# Patient Record
Sex: Female | Born: 1985 | Race: White | Hispanic: Yes | Marital: Married | Smoking: Never smoker
Health system: Southern US, Community
[De-identification: ages and names within clinical notes are randomized; demographics above are authoritative.]

## PROBLEM LIST (undated history)

## (undated) ENCOUNTER — Inpatient Hospital Stay (HOSPITAL_COMMUNITY): Payer: Self-pay

## (undated) DIAGNOSIS — R52 Pain, unspecified: Secondary | ICD-10-CM

## (undated) DIAGNOSIS — Z789 Other specified health status: Secondary | ICD-10-CM

## (undated) DIAGNOSIS — O9089 Other complications of the puerperium, not elsewhere classified: Secondary | ICD-10-CM

---

## 2003-10-26 ENCOUNTER — Inpatient Hospital Stay (HOSPITAL_COMMUNITY): Admission: AD | Admit: 2003-10-26 | Discharge: 2003-10-26 | Payer: Self-pay | Admitting: Obstetrics & Gynecology

## 2003-10-29 ENCOUNTER — Inpatient Hospital Stay (HOSPITAL_COMMUNITY): Admission: AD | Admit: 2003-10-29 | Discharge: 2003-10-29 | Payer: Self-pay | Admitting: Obstetrics & Gynecology

## 2003-10-30 ENCOUNTER — Inpatient Hospital Stay (HOSPITAL_COMMUNITY): Admission: AD | Admit: 2003-10-30 | Discharge: 2003-10-30 | Payer: Self-pay | Admitting: *Deleted

## 2004-03-28 ENCOUNTER — Emergency Department (HOSPITAL_COMMUNITY): Admission: EM | Admit: 2004-03-28 | Discharge: 2004-03-28 | Payer: Self-pay | Admitting: Emergency Medicine

## 2004-03-31 ENCOUNTER — Ambulatory Visit (HOSPITAL_COMMUNITY): Admission: RE | Admit: 2004-03-31 | Discharge: 2004-03-31 | Payer: Self-pay

## 2005-05-13 ENCOUNTER — Inpatient Hospital Stay (HOSPITAL_COMMUNITY): Admission: AD | Admit: 2005-05-13 | Discharge: 2005-05-13 | Payer: Self-pay | Admitting: Obstetrics & Gynecology

## 2005-07-04 ENCOUNTER — Ambulatory Visit (HOSPITAL_COMMUNITY): Admission: RE | Admit: 2005-07-04 | Discharge: 2005-07-04 | Payer: Self-pay | Admitting: Obstetrics & Gynecology

## 2005-09-13 ENCOUNTER — Encounter: Admission: RE | Admit: 2005-09-13 | Discharge: 2005-09-19 | Payer: Self-pay | Admitting: Obstetrics

## 2005-11-12 ENCOUNTER — Inpatient Hospital Stay (HOSPITAL_COMMUNITY): Admission: AD | Admit: 2005-11-12 | Discharge: 2005-11-15 | Payer: Self-pay | Admitting: Obstetrics

## 2005-11-13 ENCOUNTER — Encounter (INDEPENDENT_AMBULATORY_CARE_PROVIDER_SITE_OTHER): Payer: Self-pay | Admitting: *Deleted

## 2007-04-04 ENCOUNTER — Ambulatory Visit: Payer: Self-pay | Admitting: Family Medicine

## 2007-04-30 ENCOUNTER — Ambulatory Visit: Payer: Self-pay | Admitting: *Deleted

## 2007-04-30 ENCOUNTER — Ambulatory Visit: Payer: Self-pay | Admitting: Family Medicine

## 2007-11-15 ENCOUNTER — Encounter (INDEPENDENT_AMBULATORY_CARE_PROVIDER_SITE_OTHER): Payer: Self-pay | Admitting: Nurse Practitioner

## 2007-11-15 LAB — CONVERTED CEMR LAB
ALT: 172 units/L — ABNORMAL HIGH (ref 0–35)
AST: 72 units/L — ABNORMAL HIGH (ref 0–37)
Albumin: 4.5 g/dL (ref 3.5–5.2)
Alkaline Phosphatase: 116 units/L (ref 39–117)
BUN: 12 mg/dL (ref 6–23)
Basophils Absolute: 0 10*3/uL (ref 0.0–0.1)
Basophils Relative: 0 % (ref 0–1)
CO2: 26 meq/L (ref 19–32)
Calcium: 9.5 mg/dL (ref 8.4–10.5)
Chloride: 101 meq/L (ref 96–112)
Cholesterol: 178 mg/dL (ref 0–200)
Creatinine, Ser: 0.5 mg/dL (ref 0.40–1.20)
Eosinophils Absolute: 0.3 10*3/uL (ref 0.0–0.7)
Eosinophils Relative: 3 % (ref 0–5)
Glucose, Bld: 110 mg/dL — ABNORMAL HIGH (ref 70–99)
HCT: 43.2 % (ref 36.0–46.0)
HDL: 37 mg/dL — ABNORMAL LOW (ref >39)
Hemoglobin: 14 g/dL (ref 12.0–15.0)
LDL Cholesterol: 109 mg/dL — ABNORMAL HIGH (ref 0–99)
Lymphocytes Relative: 36 % (ref 12–46)
Lymphs Abs: 4 10*3/uL (ref 0.7–4.0)
MCHC: 32.4 g/dL (ref 30.0–36.0)
MCV: 92.7 fL (ref 78.0–100.0)
Monocytes Absolute: 0.8 10*3/uL (ref 0.1–1.0)
Monocytes Relative: 8 % (ref 3–12)
Neutro Abs: 5.9 10*3/uL (ref 1.7–7.7)
Neutrophils Relative %: 54 % (ref 43–77)
Platelets: 337 10*3/uL (ref 150–400)
Potassium: 4.3 meq/L (ref 3.5–5.3)
RBC: 4.66 M/uL (ref 3.87–5.11)
RDW: 13.3 % (ref 11.5–15.5)
Sodium: 141 meq/L (ref 135–145)
TSH: 12.216 microintl units/mL — ABNORMAL HIGH (ref 0.350–5.50)
Total Bilirubin: 1 mg/dL (ref 0.3–1.2)
Total CHOL/HDL Ratio: 4.8
Total Protein: 8.2 g/dL (ref 6.0–8.3)
Triglycerides: 159 mg/dL — ABNORMAL HIGH (ref <150)
VLDL: 32 mg/dL (ref 0–40)
WBC: 11 10*3/uL — ABNORMAL HIGH (ref 4.0–10.5)

## 2008-02-20 ENCOUNTER — Ambulatory Visit: Payer: Self-pay | Admitting: Family Medicine

## 2008-03-17 ENCOUNTER — Ambulatory Visit: Payer: Self-pay | Admitting: Family Medicine

## 2008-03-17 ENCOUNTER — Encounter (INDEPENDENT_AMBULATORY_CARE_PROVIDER_SITE_OTHER): Payer: Self-pay | Admitting: Family Medicine

## 2008-03-17 LAB — CONVERTED CEMR LAB
Chlamydia, DNA Probe: NEGATIVE
GC Probe Amp, Genital: NEGATIVE

## 2011-01-27 NOTE — Discharge Summary (Signed)
[name], [name]                ACCOUNT NO.:  [id number]   MEDICAL RECORD NO.:  [id number]          PATIENT TYPE:  INP   LOCATION:  9105                          FACILITY:  [facility]   PHYSICIAN:  Roseanna Rainbow, M.D.DATE OF BIRTH:  Apr 23, 1986   DATE OF ADMISSION:  11/12/2005  DATE OF DISCHARGE:  11/15/2005                                 DISCHARGE SUMMARY   CHIEF COMPLAINT:  The patient is a 25 year old, G2, P0 with an estimated  date of confinement of November 28, 2005, complaining of rupture membranes and  uterine contractions.   HISTORY OF PRESENT ILLNESS:  Please see the above.   PAST SURGICAL HISTORY:  None.   PAST MEDICAL HISTORY:  None.   MEDICATIONS:  Prenatal vitamins.   ALLERGIES:  No known drug allergies.   SOCIAL HISTORY:  She is married.  She denies any tobacco, ethanol or drug  use.   PHYSICAL EXAMINATION:  VITAL SIGNS:  Temperature 97.8, pulse 93,  respirations 20, blood pressure 127/78.  ABDOMEN:  Gravid and nontender.  PELVIC:  Cervix 4 cm dilated, 100% effaced.  Vertex head and -1 per the R.N.  External fetal monitoring reactive.   ASSESSMENT/PLAN:  Term pregnancy in active labor.   HOSPITAL COURSE:  The patient was admitted and was subsequently found to  have a breech presentation at which point she was brought for cesarean  delivery.  Please see the dictated operative summary.  Her postop course was  remarkable for a hemoglobin of 8.7.  On postop day #1, she was  hemodynamically stable.  The remainder of her hospital course was  uneventful.  She was discharged to home on postop day #3 tolerating a  regular diet.   DISCHARGE DIAGNOSES:  1.  Intrauterine pregnancy at term.  2.  Breech presentation.  3.  Active labor.   PROCEDURE:  Primary cesarean delivery.   CONDITION ON DISCHARGE:  Good.   DIET:  Regular.   ACTIVITY:  Routine.   DISCHARGE MEDICATIONS:  1.  Percocet.  2.  Prenatal vitamins.  3.  Micronor.  4.  Ferrous sulfate.   DISPOSITION:  The patient was to follow up in the office in 2 weeks.      Roseanna Rainbow, M.D.  Electronically Signed     LAJ/MEDQ  D:  12/09/2005  T:  12/10/2005  Job:  [id number]

## 2011-01-27 NOTE — Op Note (Signed)
[name], [name]                ACCOUNT NO.:  [id number]   MEDICAL RECORD NO.:  [id number]          PATIENT TYPE:  INP   LOCATION:  9105                          FACILITY:  [facility]   PHYSICIAN:  Charles A. Clearance Coots, M.D.DATE OF BIRTH:  [birth year]   DATE OF PROCEDURE:  11/12/2005  DATE OF DISCHARGE:                                 OPERATIVE REPORT   PREOPERATIVE DIAGNOSIS:  Term pregnancy, active labor, breech presentation.   POSTOPERATIVE DIAGNOSIS:  Term pregnancy, active labor, breech presentation.   PROCEDURE:  Primary low transverse cesarean section.   SURGEON:  Dr. Coral Ceo   ANESTHESIA:  Epidural.   ESTIMATED BLOOD LOSS:  800 mL.   IV FLUIDS:  1000 mL   URINE OUTPUT:  200 mL clear.   COMPLICATIONS:  None.  Foley to gravity.   FINDINGS:  A viable female at 2252, Apgars of 8 at one minute, 9 at five  minutes, weight of 7 pounds 3 ounces, normal uterus, ovaries, and fallopian  tubes.   OPERATION:  The patient was brought to the operating room and after  satisfactory redosing of the epidural, the abdomen was prepped and draped in  the usual sterile fashion.  Pfannenstiel's skin incision was made with a  scalpel, and it was deepened down to the fascia with the scalpel.  Fascia  was nicked in the midline, and the fascial incision was extended to left and  to the right with curved Mayo scissors.  The superior and inferior fascial  edges were taken off the rectus muscle with blunt and sharp dissection.  The  rectus muscles bluntly divided in midline.  Peritoneum was entered digitally  and was digitally extended to the left and to the right.  Bladder blade was  positioned, and the vesicouterine fold with peritoneum above the reflection  of the urinary bladder was grasped with forceps and was incised and  undermined with Metzenbaum scissors.  The incision was extended to the left  and to the right with the Metzenbaum scissors.  The bladder flap was bluntly  developed, and  the bladder blade was repositioned in front of the urinary  bladder, placing it well out of the operative field.  The uterus was then  entered transversely in the lower uterine segment with the scalpel.  The  clear fluid was expelled.  The uterine incision was extended to the left and  to the right digitally.  The infant was then delivered as a frank breech in  routine fashion without complications.  The infant's mouth and nose were  suctioned with a suction bulb, and the umbilical cord was doubly clamped and  cut, and the infant was handed off to the nursery staff.  Cord blood was  obtained, and placenta was spontaneously expelled from the uterine cavity  intact.  The endometrial surface was thoroughly debrided with a dry lap  sponge.  The edges of the uterine incision were grasped with ring forceps,  and the uterus was closed with continuous interlocking suture of 0 Monocryl.  Hemostasis was excellent.  The pelvic cavity was thoroughly irrigated with  warm  saline solution, and all areas of clot were removed.  There was no  active bleeding from the closure of the uterus.  The abdomen was then closed  as follows.  Peritoneum was closed with continuous suture of 2-0 Monocryl.  Fascia was closed with continuous suture of 0 Vicryl.  Subcutaneous tissue  was thoroughly irrigated with warm saline solution, and all areas of  subcutaneous bleeding were coagulated with the Bovie.  The skin edges were  approximated with sterile stainless steel staples.  Sterile bandage was  applied to the incision closure.  Surgical technician indicated that all  needle, sponge, and instrument counts were correct.  The patient tolerated  the procedure well, transported to the recovery room in satisfactory  condition.      Charles A. Clearance Coots, M.D.  Electronically Signed     CAH/MEDQ  D:  11/12/2005  T:  11/13/2005  Job:  [id number]

## 2011-05-25 ENCOUNTER — Encounter (HOSPITAL_COMMUNITY): Payer: Self-pay

## 2011-05-25 ENCOUNTER — Inpatient Hospital Stay (HOSPITAL_COMMUNITY)
Admission: AD | Admit: 2011-05-25 | Discharge: 2011-05-25 | Disposition: A | Discharge status: Home / Self Care | Payer: Medicaid Other | Source: Ambulatory Visit | Attending: Obstetrics & Gynecology | Admitting: Obstetrics & Gynecology

## 2011-05-25 DIAGNOSIS — N949 Unspecified condition associated with female genital organs and menstrual cycle: Secondary | ICD-10-CM

## 2011-05-25 DIAGNOSIS — R109 Unspecified abdominal pain: Secondary | ICD-10-CM | POA: Insufficient documentation

## 2011-05-25 DIAGNOSIS — O99891 Other specified diseases and conditions complicating pregnancy: Secondary | ICD-10-CM | POA: Insufficient documentation

## 2011-05-25 HISTORY — DX: Other specified health status: Z78.9

## 2011-05-25 LAB — WET PREP, GENITAL
Clue Cells Wet Prep HPF POC: NONE SEEN
Trich, Wet Prep: NONE SEEN
Yeast Wet Prep HPF POC: NONE SEEN

## 2011-05-25 LAB — URINALYSIS, ROUTINE W REFLEX MICROSCOPIC
Bilirubin Urine: NEGATIVE
Glucose, UA: NEGATIVE mg/dL
Hgb urine dipstick: NEGATIVE
Ketones, ur: NEGATIVE mg/dL
Nitrite: NEGATIVE
Protein, ur: NEGATIVE mg/dL
Specific Gravity, Urine: 1.015 (ref 1.005–1.030)
Urobilinogen, UA: 0.2 mg/dL (ref 0.0–1.0)
pH: 8 (ref 5.0–8.0)

## 2011-05-25 LAB — URINE MICROSCOPIC-ADD ON

## 2011-05-25 NOTE — ED Provider Notes (Signed)
Attestation of Attending Supervision of Advanced Practitioner: Evaluation and management procedures were performed by the PA/NP/CNM/OB Fellow under my supervision/collaboration. Chart reviewed and agree with management and plan.  Ashaunti Treptow A 05/25/2011 11:22 AM

## 2011-05-25 NOTE — ED Provider Notes (Signed)
Attestation of Attending Supervision of Advanced Practitioner: Evaluation and management procedures were performed by the PA/NP/CNM/OB Fellow under my supervision/collaboration. Chart reviewed and agree with management and plan.  Novalyn Lajara A 05/25/2011 6:49 PM   

## 2011-05-25 NOTE — ED Provider Notes (Signed)
History     Chief Complaint  Patient presents with   Abdominal Pain   HPI [name] is a G3P1011 at [redacted] wks GA by LMP who presents with 2 day history of lower abdominal pain.  Describes the pain as crampy and "similar to pain during period", rates the pain as 3-6/10.  Pain present in right and left lower abdominal quadrants, denies radiation of pain to back.  Pain aggravated by activity and alleviated when she is resting. Reports 2 week history of white discharge that is not associated with vaginal pain, bleeding, pruritis or foul odor.  Reports being sexually active and denies use of barrier protection.  Denies dysuria, urinary frequency or urgency.  States the pregnancy has been uncomplicated to date.  Reports history of similar pain during previous pregnancy.  OB History    Grav Para Term Preterm Abortions TAB SAB Ect Mult Living   3 1 1  1  1   1       Past Medical History  Diagnosis Date   No pertinent past medical history     Past Surgical History  Procedure Date   Cesarean section     No family history on file.  History  Substance Use Topics   Smoking status: Never Smoker    Smokeless tobacco: Not on file   Alcohol Use: No    Allergies: No Known Allergies  Prescriptions prior to admission  Medication Sig Dispense Refill   prenatal vitamin w/FE, FA (PRENATAL 1 + 1) 27-1 MG TABS Take 1 tablet by mouth daily.          Review of Systems  Constitutional: Negative for fever and chills.  HENT: Negative for hearing loss, ear pain, nosebleeds, congestion, sore throat, tinnitus and ear discharge.   Eyes: Negative.   Respiratory: Negative for cough, hemoptysis, sputum production, shortness of breath, wheezing and stridor.   Cardiovascular: Negative.   Gastrointestinal: Positive for abdominal pain (2 day hisotry abdominal pain in R and L lower abd quadrants), constipation and blood in stool. Negative for nausea, vomiting and diarrhea.  Genitourinary: Negative for  dysuria, urgency, frequency, hematuria and flank pain.  Skin: Negative.   Neurological: Negative.  Negative for headaches.  Psychiatric/Behavioral: Negative.    Physical Exam   Blood pressure 118/74, pulse 84, temperature 98.5 F (36.9 C), temperature source Oral, resp. rate 16, height 5\' 3"  (1.6 m), weight 122 lb (55.339 kg), last menstrual period 02/02/2011, SpO2 98.00%.  Results for orders placed during the hospital encounter of 05/25/11 (from the past 48 hour(s))  URINALYSIS, ROUTINE W REFLEX MICROSCOPIC     Status: Abnormal   Collection Time   05/25/11  8:23 AM      Component Value Range Comment   Color, Urine YELLOW  YELLOW     Appearance CLEAR  CLEAR     Specific Gravity, Urine 1.015  1.005 - 1.030     pH 8.0  5.0 - 8.0     Glucose, UA NEGATIVE  NEGATIVE (mg/dL)    Hgb urine dipstick NEGATIVE  NEGATIVE     Bilirubin Urine NEGATIVE  NEGATIVE     Ketones, ur NEGATIVE  NEGATIVE (mg/dL)    Protein, ur NEGATIVE  NEGATIVE (mg/dL)    Urobilinogen, UA 0.2  0.0 - 1.0 (mg/dL)    Nitrite NEGATIVE  NEGATIVE     Leukocytes, UA SMALL (*) NEGATIVE    URINE MICROSCOPIC-ADD ON     Status: Abnormal   Collection Time   05/25/11  8:23  AM      Component Value Range Comment   Squamous Epithelial / LPF RARE  RARE     WBC, UA 3-6  <3 (WBC/hpf)    Bacteria, UA FEW (*) RARE     Urine-Other MUCOUS PRESENT     WET PREP, GENITAL     Status: Abnormal   Collection Time   05/25/11  9:43 AM      Component Value Range Comment   Yeast, Wet Prep NONE SEEN  NONE SEEN     Trich, Wet Prep NONE SEEN  NONE SEEN     Clue Cells, Wet Prep NONE SEEN  NONE SEEN     WBC, Wet Prep HPF POC FEW (*) NONE SEEN  MODERATE BACTERIA SEEN     Physical Exam  Constitutional: She is oriented to person, place, and time. She appears well-developed and well-nourished. No distress.  HENT:  Head: Normocephalic and atraumatic.  Eyes: Conjunctivae and EOM are normal. No scleral icterus.  Neck: Normal range of motion. Neck  supple. No thyromegaly present.  Cardiovascular: Normal rate, regular rhythm and normal heart sounds.   Respiratory: Breath sounds normal. No respiratory distress. She has no wheezes. She has no rales. She exhibits no tenderness.  GI: Soft. Bowel sounds are normal. She exhibits no distension and no mass. There is tenderness (Tender to palpation in R and L lower abd quadrants.  ). There is no rebound and no guarding.  Genitourinary: Vaginal discharge found.       Speculum exam revealed scant amount of white/grey discharge in posterior vagina.  Vaginal walls without lesions or laceration.  No visible blood present in vagina or originating from cervical os.  Cervical os signigicant for white discharge.  Cervix is with out lesion or ulceration. Cervix non erythematous.     On bimanual exam patient was tender to palpation.  Uterus palpation and appropriate for EGA.  Ovaries nonpalpable bilaterally.   Musculoskeletal: Normal range of motion.  Neurological: She is alert and oriented to person, place, and time. She has normal reflexes.  Skin: Skin is warm and dry. She is not diaphoretic.  Psychiatric: She has a normal mood and affect. Her behavior is normal. Judgment and thought content normal.    Fetal heart sounds present via doppler.  FHR : 155  MAU Course  Procedures  G/C culture and wet prep to lab.  MDM   Assessment and Plan  Abdominal pain in second trimester Round ligament pain  Plan:  Advise to take Tylenol as needed. Keep scheduled appt with [facility] on Sept 25th.  Adam Chenevert PA-S 05/25/2011, 9:41 AM

## 2011-05-25 NOTE — Progress Notes (Signed)
Pt states that she has been having lower abdominal pain for 2 days that comes and goes. No bleeding or discharge. Pt has her first appointment with [facility] on 9-25.

## 2011-05-25 NOTE — ED Provider Notes (Addendum)
History     CSN: [id number] Arrival date & time: 05/25/2011  8:12 AM  Chief Complaint  Patient presents with  . Abdominal Pain   HPI    SEE PA student's note  Past Medical History  Diagnosis Date  . No pertinent past medical history     Past Surgical History  Procedure Date  . Cesarean section     No family history on file.  History  Substance Use Topics  . Smoking status: Never Smoker   . Smokeless tobacco: Not on file  . Alcohol Use: No    OB History    Grav Para Term Preterm Abortions TAB SAB Ect Mult Living   3 1 1  1  1   1       Review of Systems  Physical Exam  BP 118/74  Pulse 84  Temp(Src) 98.5 F (36.9 C) (Oral)  Resp 16  Ht 5\' 3"  (1.6 m)  Wt 122 lb (55.339 kg)  BMI 21.61 kg/m2  SpO2 98%  LMP 02/02/2011  Physical Exam  ED Course  Procedures  MDM I have reviewed the student's note, observed his physical exam and agree with his findings and plan of care.       Matt Holmes, NP 05/25/11 1032  Matt Holmes, NP 05/25/11 1326

## 2011-05-26 LAB — GC/CHLAMYDIA PROBE AMP, GENITAL
Chlamydia, DNA Probe: NEGATIVE
GC Probe Amp, Genital: NEGATIVE

## 2011-06-08 LAB — RUBELLA ANTIBODY, IGM: Rubella: IMMUNE

## 2011-06-08 LAB — HIV ANTIBODY (ROUTINE TESTING W REFLEX): HIV: NONREACTIVE

## 2011-06-08 LAB — ABO/RH: RH Type: POSITIVE

## 2011-09-12 NOTE — L&D Delivery Note (Signed)
Delivery Note At 1:54 PM a viable female was delivered via NSVD  (Presentation: Right Occiput Anterior).  APGAR: 9, 9; weight 6 lb 13.2 oz (3095 g).   Placenta status: Intact, Spontaneous.  Cord: 3 vessels with the following complications: None.  Cord pH: none  Anesthesia: Epidural  Episiotomy: Median Lacerations: None Suture Repair: 2.0 chromic Est. Blood Loss (mL): 400  Mom to postpartum.  Baby to nursery-stable.  HARPER,CHARLES A 10/16/2011, 2:23 PM

## 2011-09-28 ENCOUNTER — Inpatient Hospital Stay (HOSPITAL_COMMUNITY)
Admission: AD | Admit: 2011-09-28 | Discharge: 2011-09-28 | Disposition: A | Discharge status: Home / Self Care | Payer: Medicaid Other | Source: Ambulatory Visit | Attending: Obstetrics & Gynecology | Admitting: Obstetrics & Gynecology

## 2011-09-28 ENCOUNTER — Encounter (HOSPITAL_COMMUNITY): Payer: Self-pay | Admitting: *Deleted

## 2011-09-28 DIAGNOSIS — O4700 False labor before 37 completed weeks of gestation, unspecified trimester: Secondary | ICD-10-CM | POA: Insufficient documentation

## 2011-09-28 LAB — URINALYSIS, ROUTINE W REFLEX MICROSCOPIC
Bilirubin Urine: NEGATIVE
Glucose, UA: NEGATIVE mg/dL
Hgb urine dipstick: NEGATIVE
Ketones, ur: NEGATIVE mg/dL
Nitrite: NEGATIVE
Protein, ur: NEGATIVE mg/dL
Specific Gravity, Urine: 1.01 (ref 1.005–1.030)
Urobilinogen, UA: 0.2 mg/dL (ref 0.0–1.0)
pH: 6 (ref 5.0–8.0)

## 2011-09-28 LAB — URINE MICROSCOPIC-ADD ON

## 2011-09-28 MED ORDER — TERBUTALINE SULFATE 1 MG/ML IJ SOLN
0.2500 mg | Freq: Once | INTRAMUSCULAR | Status: AC
  Administered 2011-09-28: 0.25 mg via SUBCUTANEOUS
  Filled 2011-09-28: qty 1

## 2011-09-28 NOTE — Progress Notes (Signed)
FFN collected, VE done per CNM.

## 2011-09-28 NOTE — Progress Notes (Signed)
N. Frazier, CNM at bedside.  Assessment done and poc discussed with pt.  

## 2011-09-28 NOTE — Progress Notes (Signed)
Pt presents to [facility]au for contractions. States she has felt them for the last 3 days but tonight around midnight they became more regular and painful. Pt states + fetal movement.

## 2011-09-28 NOTE — Progress Notes (Signed)
Notified of no cervical change.  Notified of contractions irregular with pt stating ctx are better.  Ok to [employer] home. Have pt follow up in office tomorrow.

## 2011-09-28 NOTE — ED Provider Notes (Signed)
History     Chief Complaint  Patient presents with  . Contractions   HPI 26 y.o. G3P1011 at [redacted]w[redacted]d with contractions x 3 days, closer and stronger tonight. No LOF or vaginal bleeding. + fetal movement. H/O c/s for breech with last pregnancy, hoping for VBAC.    Past Medical History  Diagnosis Date  . No pertinent past medical history     Past Surgical History  Procedure Date  . Cesarean section     History reviewed. No pertinent family history.  History  Substance Use Topics  . Smoking status: Never Smoker   . Smokeless tobacco: Not on file  . Alcohol Use: No    Allergies: No Known Allergies  No prescriptions prior to admission    Review of Systems  Constitutional: Negative.   Respiratory: Negative.   Cardiovascular: Negative.   Gastrointestinal: Negative for nausea, vomiting, abdominal pain, diarrhea and constipation.  Genitourinary: Negative for dysuria, urgency, frequency, hematuria and flank pain.       Negative for vaginal bleeding, Positive for contractions  Musculoskeletal: Negative.   Neurological: Negative.   Psychiatric/Behavioral: Negative.    Physical Exam   Blood pressure 125/59, pulse 75, temperature 97.8 F (36.6 C), temperature source Oral, resp. rate 18, height 5' (1.524 m), last menstrual period 02/02/2011.  Physical Exam  Nursing note and vitals reviewed. Constitutional: She is oriented to person, place, and time. She appears well-developed and well-nourished.  Cardiovascular: Normal rate.   Respiratory: Effort normal.  GI: Soft. There is no tenderness.  Genitourinary: Cervix exhibits discharge (clear mucous). No bleeding around the vagina. No vaginal discharge found.       SVE: 2/75/-2  Musculoskeletal: Normal range of motion.  Neurological: She is alert and oriented to person, place, and time.  Skin: Skin is warm and dry.  Psychiatric: She has a normal mood and affect.    MAU Course  Procedures Results for orders placed during the  hospital encounter of 09/28/11 (from the past 24 hour(s))  URINALYSIS, ROUTINE W REFLEX MICROSCOPIC     Status: Abnormal   Collection Time   09/28/11  4:10 AM      Component Value Range   Color, Urine YELLOW  YELLOW    APPearance CLEAR  CLEAR    Specific Gravity, Urine 1.010  1.005 - 1.030    pH 6.0  5.0 - 8.0    Glucose, UA NEGATIVE  NEGATIVE (mg/dL)   Hgb urine dipstick NEGATIVE  NEGATIVE    Bilirubin Urine NEGATIVE  NEGATIVE    Ketones, ur NEGATIVE  NEGATIVE (mg/dL)   Protein, ur NEGATIVE  NEGATIVE (mg/dL)   Urobilinogen, UA 0.2  0.0 - 1.0 (mg/dL)   Nitrite NEGATIVE  NEGATIVE    Leukocytes, UA SMALL (*) NEGATIVE   URINE MICROSCOPIC-ADD ON     Status: Abnormal   Collection Time   09/28/11  4:10 AM      Component Value Range   Squamous Epithelial / LPF MANY (*) RARE    WBC, UA 0-2  <3 (WBC/hpf)    Terbutaline 0.25 Admire for tocolysis. Contractions stopped, no cervical change after 2 hours.   Assessment and Plan  26 y.o. G3P1011 at [redacted]w[redacted]d Threatened preterm labor  D/C home, f/u in office tomorrow  Antanisha Mohs 09/28/2011, 8:00 AM

## 2011-09-28 NOTE — Progress Notes (Signed)
Pt up to bathroom at this time

## 2011-09-28 NOTE — Progress Notes (Signed)
Pt states ctx have slowed.  Feels better.

## 2011-10-12 LAB — STREP B DNA PROBE: GBS: NEGATIVE

## 2011-10-16 ENCOUNTER — Encounter (HOSPITAL_COMMUNITY): Payer: Self-pay | Admitting: *Deleted

## 2011-10-16 ENCOUNTER — Encounter (HOSPITAL_COMMUNITY): Payer: Self-pay | Admitting: Anesthesiology

## 2011-10-16 ENCOUNTER — Inpatient Hospital Stay (HOSPITAL_COMMUNITY): Payer: Medicaid Other | Admitting: Anesthesiology

## 2011-10-16 ENCOUNTER — Inpatient Hospital Stay (HOSPITAL_COMMUNITY)
Admission: AD | Admit: 2011-10-16 | Discharge: 2011-10-18 | DRG: 775 | Disposition: A | Discharge status: Home / Self Care | Payer: Medicaid Other | Source: Ambulatory Visit | Attending: Obstetrics | Admitting: Obstetrics

## 2011-10-16 DIAGNOSIS — O34219 Maternal care for unspecified type scar from previous cesarean delivery: Principal | ICD-10-CM | POA: Diagnosis present

## 2011-10-16 LAB — CBC
HCT: 38.7 % (ref 36.0–46.0)
Hemoglobin: 13.1 g/dL (ref 12.0–15.0)
RDW: 13.1 % (ref 11.5–15.5)
WBC: 7.5 10*3/uL (ref 4.0–10.5)

## 2011-10-16 MED ORDER — ONDANSETRON HCL 4 MG/2ML IJ SOLN: 4.0000 mg | INTRAMUSCULAR | Status: DC | PRN

## 2011-10-16 MED ORDER — LACTATED RINGERS IV SOLN
INTRAVENOUS | Status: DC
  Administered 2011-10-16: 12:00:00 via INTRAVENOUS

## 2011-10-16 MED ORDER — OXYCODONE-ACETAMINOPHEN 5-325 MG PO TABS
1.0000 | ORAL_TABLET | ORAL | Status: DC | PRN
  Administered 2011-10-17 – 2011-10-18 (×6): 1 via ORAL
  Filled 2011-10-16 (×6): qty 1

## 2011-10-16 MED ORDER — SIMETHICONE 80 MG PO CHEW: 80.0000 mg | CHEWABLE_TABLET | ORAL | Status: DC | PRN

## 2011-10-16 MED ORDER — ZOLPIDEM TARTRATE 5 MG PO TABS: 5.0000 mg | ORAL_TABLET | Freq: Every evening | ORAL | Status: DC | PRN

## 2011-10-16 MED ORDER — EPHEDRINE 5 MG/ML INJ
10.0000 mg | INTRAVENOUS | Status: DC | PRN
  Filled 2011-10-16 (×2): qty 4

## 2011-10-16 MED ORDER — LANOLIN HYDROUS EX OINT: TOPICAL_OINTMENT | CUTANEOUS | Status: DC | PRN

## 2011-10-16 MED ORDER — PRENATAL MULTIVITAMIN CH
1.0000 | ORAL_TABLET | Freq: Every day | ORAL | Status: DC
  Administered 2011-10-16 – 2011-10-18 (×3): 1 via ORAL
  Filled 2011-10-16 (×3): qty 1

## 2011-10-16 MED ORDER — DIPHENHYDRAMINE HCL 50 MG/ML IJ SOLN: 12.5000 mg | INTRAMUSCULAR | Status: DC | PRN

## 2011-10-16 MED ORDER — FENTANYL 2.5 MCG/ML BUPIVACAINE 1/10 % EPIDURAL INFUSION (WH - ANES)
14.0000 mL/h | INTRAMUSCULAR | Status: DC
  Administered 2011-10-16: 14 mL/h via EPIDURAL
  Filled 2011-10-16 (×2): qty 60

## 2011-10-16 MED ORDER — IBUPROFEN 600 MG PO TABS: 600.0000 mg | ORAL_TABLET | Freq: Four times a day (QID) | ORAL | Status: DC | PRN

## 2011-10-16 MED ORDER — ACETAMINOPHEN 325 MG PO TABS: 650.0000 mg | ORAL_TABLET | ORAL | Status: DC | PRN

## 2011-10-16 MED ORDER — SODIUM CHLORIDE 0.9 % IV SOLN
2.0000 g | Freq: Once | INTRAVENOUS | Status: DC
  Filled 2011-10-16: qty 2000

## 2011-10-16 MED ORDER — WITCH HAZEL-GLYCERIN EX PADS: 1.0000 "application " | MEDICATED_PAD | CUTANEOUS | Status: DC | PRN

## 2011-10-16 MED ORDER — DIBUCAINE 1 % RE OINT: 1.0000 "application " | TOPICAL_OINTMENT | RECTAL | Status: DC | PRN

## 2011-10-16 MED ORDER — MEDROXYPROGESTERONE ACETATE 150 MG/ML IM SUSP: 150.0000 mg | INTRAMUSCULAR | Status: DC | PRN

## 2011-10-16 MED ORDER — FLEET ENEMA 7-19 GM/118ML RE ENEM: 1.0000 | ENEMA | RECTAL | Status: DC | PRN

## 2011-10-16 MED ORDER — OXYTOCIN BOLUS FROM INFUSION
500.0000 mL | Freq: Once | INTRAVENOUS | Status: DC
  Filled 2011-10-16: qty 1000
  Filled 2011-10-16: qty 500

## 2011-10-16 MED ORDER — CITRIC ACID-SODIUM CITRATE 334-500 MG/5ML PO SOLN: 30.0000 mL | ORAL | Status: DC | PRN

## 2011-10-16 MED ORDER — IBUPROFEN 600 MG PO TABS
600.0000 mg | ORAL_TABLET | Freq: Four times a day (QID) | ORAL | Status: DC
  Administered 2011-10-16 – 2011-10-18 (×8): 600 mg via ORAL
  Filled 2011-10-16 (×9): qty 1

## 2011-10-16 MED ORDER — TETANUS-DIPHTH-ACELL PERTUSSIS 5-2.5-18.5 LF-MCG/0.5 IM SUSP
0.5000 mL | Freq: Once | INTRAMUSCULAR | Status: AC
  Administered 2011-10-17: 0.5 mL via INTRAMUSCULAR

## 2011-10-16 MED ORDER — OXYTOCIN 10 UNIT/ML IJ SOLN
40.0000 [IU] | Freq: Once | INTRAVENOUS | Status: AC
  Administered 2011-10-16: 40 [IU] via INTRAVENOUS
  Filled 2011-10-16: qty 4

## 2011-10-16 MED ORDER — LIDOCAINE HCL (PF) 1 % IJ SOLN
30.0000 mL | INTRAMUSCULAR | Status: DC | PRN
  Administered 2011-10-16: 30 mL via SUBCUTANEOUS
  Filled 2011-10-16: qty 30

## 2011-10-16 MED ORDER — ONDANSETRON HCL 4 MG PO TABS: 4.0000 mg | ORAL_TABLET | ORAL | Status: DC | PRN

## 2011-10-16 MED ORDER — OXYTOCIN 20 UNITS IN LACTATED RINGERS INFUSION - SIMPLE
125.0000 mL/h | Freq: Once | INTRAVENOUS | Status: AC
  Administered 2011-10-16: 999 mL/h via INTRAVENOUS

## 2011-10-16 MED ORDER — LACTATED RINGERS IV SOLN: 500.0000 mL | Freq: Once | INTRAVENOUS | Status: DC

## 2011-10-16 MED ORDER — OXYTOCIN 20 UNITS IN LACTATED RINGERS INFUSION - SIMPLE: 125.0000 mL/h | INTRAVENOUS | Status: DC | PRN

## 2011-10-16 MED ORDER — BENZOCAINE-MENTHOL 20-0.5 % EX AERO: 1.0000 "application " | INHALATION_SPRAY | CUTANEOUS | Status: DC | PRN

## 2011-10-16 MED ORDER — LIDOCAINE HCL 1.5 % IJ SOLN
INTRAMUSCULAR | Status: DC | PRN
  Administered 2011-10-16 (×2): 5 mL via EPIDURAL

## 2011-10-16 MED ORDER — METHYLERGONOVINE MALEATE 0.2 MG PO TABS: 0.2000 mg | ORAL_TABLET | ORAL | Status: DC | PRN

## 2011-10-16 MED ORDER — PHENYLEPHRINE 40 MCG/ML (10ML) SYRINGE FOR IV PUSH (FOR BLOOD PRESSURE SUPPORT)
80.0000 ug | PREFILLED_SYRINGE | INTRAVENOUS | Status: DC | PRN
  Filled 2011-10-16 (×2): qty 5

## 2011-10-16 MED ORDER — DIPHENHYDRAMINE HCL 25 MG PO CAPS: 25.0000 mg | ORAL_CAPSULE | Freq: Four times a day (QID) | ORAL | Status: DC | PRN

## 2011-10-16 MED ORDER — EPHEDRINE 5 MG/ML INJ: 10.0000 mg | INTRAVENOUS | Status: DC | PRN

## 2011-10-16 MED ORDER — METHYLERGONOVINE MALEATE 0.2 MG/ML IJ SOLN: 0.2000 mg | INTRAMUSCULAR | Status: DC | PRN

## 2011-10-16 MED ORDER — OXYCODONE-ACETAMINOPHEN 5-325 MG PO TABS: 1.0000 | ORAL_TABLET | ORAL | Status: DC | PRN

## 2011-10-16 MED ORDER — SENNOSIDES-DOCUSATE SODIUM 8.6-50 MG PO TABS
2.0000 | ORAL_TABLET | Freq: Every day | ORAL | Status: DC
  Administered 2011-10-16 – 2011-10-17 (×2): 2 via ORAL

## 2011-10-16 MED ORDER — ONDANSETRON HCL 4 MG/2ML IJ SOLN: 4.0000 mg | Freq: Four times a day (QID) | INTRAMUSCULAR | Status: DC | PRN

## 2011-10-16 MED ORDER — LACTATED RINGERS IV SOLN: 500.0000 mL | INTRAVENOUS | Status: DC | PRN

## 2011-10-16 MED ORDER — MAGNESIUM HYDROXIDE 400 MG/5ML PO SUSP: 30.0000 mL | ORAL | Status: DC | PRN

## 2011-10-16 MED ORDER — PHENYLEPHRINE 40 MCG/ML (10ML) SYRINGE FOR IV PUSH (FOR BLOOD PRESSURE SUPPORT): 80.0000 ug | PREFILLED_SYRINGE | INTRAVENOUS | Status: DC | PRN

## 2011-10-16 NOTE — Progress Notes (Signed)
SROM @ 1020 this AM; G2P1 @ 36w 4d;

## 2011-10-16 NOTE — Progress Notes (Signed)
ROBIE OATS is a 26 y.o. G3P1011 at [redacted]w[redacted]d by LMP admitted for active labor, rupture of membranes  Subjective:   Objective: BP 140/70  Pulse 101  Temp(Src) 98 F (36.7 C) (Oral)  Resp 18  Ht 5' (1.524 m)  Wt 156 lb (70.761 kg)  BMI 30.47 kg/m2  SpO2 99%  LMP 02/02/2011      FHT:  FHR: 150 bpm, variability: moderate,  accelerations:  Present,  decelerations:  Absent UC:   regular, every 3 minutes SVE:   Dilation: 10 Effacement (%): 100 Station: +2 Exam by:: Rzhang,rnc-ob  Labs: Lab Results  Component Value Date   WBC 7.5 10/16/2011   HGB 13.1 10/16/2011   HCT 38.7 10/16/2011   MCV 91.5 10/16/2011   PLT 230 10/16/2011    Assessment / Plan: Spontaneous labor, progressing normally  Labor: Progressing normally Preeclampsia:  n/a Fetal Wellbeing:  Category I Pain Control:  Epidural I/D:  n/a Anticipated MOD:  NSVD  Veryl Abril A 10/16/2011, 1:33 PM

## 2011-10-16 NOTE — Anesthesia Procedure Notes (Signed)
Epidural Patient location during procedure: OB Start time: 10/16/2011 12:25 PM  Staffing Anesthesiologist: Brayton Caves R Performed by: anesthesiologist   Preanesthetic Checklist Completed: patient identified, site marked, surgical consent, pre-op evaluation, timeout performed, IV checked, risks and benefits discussed and monitors and equipment checked  Epidural Patient position: sitting Prep: site prepped and draped and DuraPrep Patient monitoring: continuous pulse ox and blood pressure Approach: midline Injection technique: LOR air and LOR saline  Needle:  Needle type: Tuohy  Needle gauge: 17 G Needle length: 9 cm Needle insertion depth: 4 cm Catheter type: closed end flexible Catheter size: 19 Gauge Catheter at skin depth: 8 cm Test dose: negative  Assessment Events: blood not aspirated, injection not painful, no injection resistance, negative IV test and no paresthesia  Additional Notes Patient identified.  Risk benefits discussed including failed block, incomplete pain control, headache, nerve damage, paralysis, blood pressure changes, nausea, vomiting, reactions to medication both toxic or allergic, and postpartum back pain.  Patient expressed understanding and wished to proceed.  All questions were answered.  Sterile technique used throughout procedure and epidural site dressed with sterile barrier dressing. No paresthesia or other complications noted.The patient did not experience any signs of intravascular injection such as tinnitus or metallic taste in mouth nor signs of intrathecal spread such as rapid motor block. Please see nursing notes for vital signs.

## 2011-10-16 NOTE — Anesthesia Postprocedure Evaluation (Signed)
  Anesthesia Post-op Note  Patient: [name]  Procedure(s) Performed: * No procedures listed *  Patient Location: PACU and Mother/Baby  Anesthesia Type: Epidural  Level of Consciousness: awake, alert  and oriented  Airway and Oxygen Therapy: Patient Spontanous Breathing  Post-op Pain: none  Post-op Assessment: Patient's Cardiovascular Status Stable and Respiratory Function Stable  Post-op Vital Signs: stable  Complications: No apparent anesthesia complications

## 2011-10-16 NOTE — H&P (Signed)
[name] is a 26 y.o. female presenting for ROM. Maternal Medical History:  Reason for admission: Reason for admission: rupture of membranes and contractions.  Contractions: Onset was 3-5 hours ago.   Frequency: regular.   Duration is approximately 1 minute.    Fetal activity: Perceived fetal activity is normal.   Last perceived fetal movement was within the past hour.    Prenatal complications: no prenatal complications Prenatal Complications - Diabetes: none.    OB History    Grav Para Term Preterm Abortions TAB SAB Ect Mult Living   3 1 1  1  1   1      Past Medical History  Diagnosis Date  . No pertinent past medical history    Past Surgical History  Procedure Date  . Cesarean section    Family History: family history is not on file. Social History:  reports that she has never smoked. She has never used smokeless tobacco. She reports that she does not drink alcohol or use illicit drugs.  Review of Systems  All other systems reviewed and are negative.    Dilation: 9 Effacement (%): 100 Station: +1 Exam by:: rzhang,rnc-ob Blood pressure 144/81, pulse 92, temperature 97.6 F (36.4 C), temperature source Oral, resp. rate 20, height 5' (1.524 m), weight 156 lb (70.761 kg), last menstrual period 02/02/2011, SpO2 99.00%. Maternal Exam:  Uterine Assessment: Contraction strength is firm.  Contraction duration is 1 minute. Abdomen: Patient reports no abdominal tenderness. Fetal presentation: vertex  Introitus: Normal vulva. Normal vagina.  Pelvis: adequate for delivery.   Cervix: Cervix evaluated by digital exam.     Physical Exam  Nursing note and vitals reviewed. Constitutional: She is oriented to person, place, and time. She appears well-developed and well-nourished.  HENT:  Head: Normocephalic and atraumatic.  Cardiovascular: Normal rate.   Respiratory: Effort normal.  GI: Soft.  Genitourinary: Vagina normal and uterus normal.  Musculoskeletal: Normal  range of motion.  Neurological: She is alert and oriented to person, place, and time. She has normal reflexes.  Skin: Skin is warm and dry.    Prenatal labs: ABO, Rh: O/Positive/-- (09/27 0000) Antibody: Negative (09/27 0000) Rubella: Immune (09/27 0000) RPR: Nonreactive (09/27 0000)  HBsAg: Negative (09/27 0000)  HIV: Non-reactive (09/27 0000)  GBS: Negative (01/31 0000)   Assessment/Plan: 36 weeks.  ROM.  Active labor.  VBAC.  Previous C/S for breech.   HARPER,CHARLES A 10/16/2011, 1:12 PM

## 2011-10-16 NOTE — Anesthesia Preprocedure Evaluation (Signed)

## 2011-10-17 LAB — CBC
HCT: 32.6 % — ABNORMAL LOW (ref 36.0–46.0)
MCH: 30.1 pg (ref 26.0–34.0)
MCHC: 32.5 g/dL (ref 30.0–36.0)
MCV: 92.6 fL (ref 78.0–100.0)
RDW: 13.4 % (ref 11.5–15.5)

## 2011-10-17 LAB — RPR: RPR Ser Ql: NONREACTIVE

## 2011-10-17 NOTE — Progress Notes (Signed)
UR Chart review completed.  

## 2011-10-17 NOTE — Progress Notes (Signed)
Post Partum Day 1 Subjective: no complaints  Objective: Blood pressure 101/67, pulse 91, temperature 98.3 F (36.8 C), temperature source Oral, resp. rate 20, height 5' (1.524 m), weight 156 lb (70.761 kg), last menstrual period 02/02/2011, SpO2 99.00%, unknown if currently breastfeeding.  Physical Exam:  General: alert and no distress Lochia: appropriate Uterine Fundus: firm Incision: healing well DVT Evaluation: No evidence of DVT seen on physical exam.   Basename 10/17/11 0538 10/16/11 1145  HGB 10.6* 13.1  HCT 32.6* 38.7    Assessment/Plan: Plan for discharge tomorrow   LOS: 1 day   HARPER,CHARLES A 10/17/2011, 1:33 PM

## 2011-10-18 MED ORDER — IBUPROFEN 600 MG PO TABS: 600.0000 mg | ORAL_TABLET | Freq: Four times a day (QID) | ORAL | Status: DC

## 2011-10-18 NOTE — Progress Notes (Signed)
Post Partum Day 2 Subjective: no complaints  Objective: Blood pressure 105/69, pulse 71, temperature 98.4 F (36.9 C), temperature source Oral, resp. rate 18, height 5' (1.524 m), weight 156 lb (70.761 kg), last menstrual period 02/02/2011, SpO2 99.00%, unknown if currently breastfeeding.  Physical Exam:  General: alert and no distress Lochia: appropriate Uterine Fundus: firm Incision: healing well DVT Evaluation: No evidence of DVT seen on physical exam.   Basename 10/17/11 0538 10/16/11 1145  HGB 10.6* 13.1  HCT 32.6* 38.7    Assessment/Plan: Discharge home   LOS: 2 days   [name] A 10/18/2011, 8:29 AM

## 2011-10-18 NOTE — Discharge Summary (Signed)
Obstetric Discharge Summary Reason for Admission: onset of labor Prenatal Procedures: ultrasound Intrapartum Procedures: spontaneous vaginal delivery Postpartum Procedures: none Complications-Operative and Postpartum: none Hemoglobin  Date Value Range Status  10/17/2011 10.6* 12.0-15.0 (g/dL) Final     DELTA CHECK NOTED     REPEATED TO VERIFY     HCT  Date Value Range Status  10/17/2011 32.6* 36.0-46.0 (%) Final    Discharge Diagnoses: Term Pregnancy-delivered  Discharge Information: Date: 10/18/2011 Activity: pelvic rest Diet: routine Medications: PNV, Ibuprofen and Colace Condition: stable Instructions: refer to practice specific booklet Discharge to: home Follow-up Information    Follow up with Jenavie Stanczak A, MD. Schedule an appointment as soon as possible for a visit in 6 weeks.   Contact information:   [address] [place] [address] [phone]          Newborn Data: Live born female  Birth Weight: 6 lb 13.2 oz (3095 g) APGAR: 9, 9  Home with mother.  Keziah Drotar A 10/18/2011, 8:35 AM

## 2011-11-08 ENCOUNTER — Encounter (HOSPITAL_COMMUNITY): Payer: Self-pay | Admitting: Emergency Medicine

## 2011-11-08 ENCOUNTER — Emergency Department (HOSPITAL_COMMUNITY)
Admission: EM | Admit: 2011-11-08 | Discharge: 2011-11-08 | Disposition: A | Discharge status: Home / Self Care | Payer: Medicaid Other | Attending: Emergency Medicine | Admitting: Emergency Medicine

## 2011-11-08 DIAGNOSIS — O99893 Other specified diseases and conditions complicating puerperium: Secondary | ICD-10-CM | POA: Insufficient documentation

## 2011-11-08 DIAGNOSIS — M549 Dorsalgia, unspecified: Secondary | ICD-10-CM | POA: Insufficient documentation

## 2011-11-08 DIAGNOSIS — T8090XA Unspecified complication following infusion and therapeutic injection, initial encounter: Secondary | ICD-10-CM

## 2011-11-08 DIAGNOSIS — Y849 Medical procedure, unspecified as the cause of abnormal reaction of the patient, or of later complication, without mention of misadventure at the time of the procedure: Secondary | ICD-10-CM | POA: Insufficient documentation

## 2011-11-08 DIAGNOSIS — T888XXA Other specified complications of surgical and medical care, not elsewhere classified, initial encounter: Secondary | ICD-10-CM | POA: Insufficient documentation

## 2011-11-08 HISTORY — DX: Other complications of the puerperium, not elsewhere classified: O90.89

## 2011-11-08 HISTORY — DX: Other complications of the puerperium, not elsewhere classified: R52

## 2011-11-08 MED ORDER — TRAMADOL HCL 50 MG PO TABS: 50.0000 mg | ORAL_TABLET | Freq: Four times a day (QID) | ORAL | Status: AC | PRN

## 2011-11-08 NOTE — ED Notes (Signed)
Patient with back pain since delivery of her child 10/16/2011.  Patient has not followed up with OBGYN after birth.  Patient states that it is lower left back.

## 2011-11-08 NOTE — ED Provider Notes (Signed)
History     CSN: [id number]  Arrival date & time 11/08/11  1649   First MD Initiated Contact with Patient 11/08/11 2021      Chief Complaint  Patient presents with  . Back Pain    (Consider location/radiation/quality/duration/timing/severity/associated sxs/prior treatment) HPI Comments: Patient had an epidural for the birth of her child 3 weeks ago.  She has had intermittent persistent pain at the site not relieved with ibuprofen.  She has not contacted her OB/GYN as of yet.  She does have a follow up appointment next Wednesday on March 6  Patient is a 26 y.o. female presenting with back pain. The history is provided by the patient.  Back Pain  This is a chronic problem. The current episode started more than 1 week ago. The problem occurs daily. The problem has not changed since onset.The pain is present in the lumbar spine. Pertinent negatives include no fever and no weakness.    Past Medical History  Diagnosis Date  . No pertinent past medical history   . Postpartum pain     Past Surgical History  Procedure Date  . Cesarean section     History reviewed. No pertinent family history.  History  Substance Use Topics  . Smoking status: Never Smoker   . Smokeless tobacco: Never Used  . Alcohol Use: No    OB History    Grav Para Term Preterm Abortions TAB SAB Ect Mult Living   3 2 1 1 1  1   2       Review of Systems  Constitutional: Negative for fever and chills.  Respiratory: Negative for shortness of breath.   Musculoskeletal: Positive for back pain.  Neurological: Negative for dizziness and weakness.    Allergies  Review of patient's allergies indicates no known allergies.  Home Medications   Current Outpatient Rx  Name Route Sig Dispense Refill  . IBUPROFEN 600 MG PO TABS Oral Take 600 mg by mouth every 6 (six) hours as needed. For pain    . PRENATAL MULTIVITAMIN CH Oral Take 1 tablet by mouth daily.    . IBUPROFEN 600 MG PO TABS Oral Take 1 tablet (600  mg total) by mouth every 6 (six) hours. 30 tablet 5  . TRAMADOL HCL 50 MG PO TABS Oral Take 1 tablet (50 mg total) by mouth every 6 (six) hours as needed for pain. 15 tablet 0    BP 104/65  Pulse 64  Temp(Src) 97.8 F (36.6 C) (Oral)  Resp 18  SpO2 99%  Breastfeeding? Yes  Physical Exam  Constitutional: She is oriented to person, place, and time. She appears well-developed and well-nourished.  Pulmonary/Chest: Effort normal.  Musculoskeletal:       There is no brain is swelling, change in color or temperature at the epidural site  Neurological: She is alert and oriented to person, place, and time.    ED Course  Procedures (including critical care time)  Labs Reviewed - No data to display No results found.   1. Injection Site Reaction       MDM  Intermittent pain at the epidural site, lumbar spine without change in color, swelling.  Patient denies fever, dizziness, headaches, dysuria, constipation, diarrhea, loss of motor function        Arman Filter, NP 11/08/11 2121  Arman Filter, NP 11/08/11 2125

## 2011-11-08 NOTE — Discharge Instructions (Signed)
Please discuss your discomfort, which Dr. Clearance Coots on your evaluation, March 6 it is pretty normal to have some discomfort at the epidural injection site.  This can last for several months.  He did not have any swelling, redness, signs of infection, loss of motor function, fever, headaches

## 2011-11-08 NOTE — ED Notes (Signed)
Pt seen and dispositioned for d/c by EDNP prior to RN assessment, see NP notes, orders received and initiated.

## 2011-11-13 NOTE — ED Provider Notes (Signed)
Medical screening examination/treatment/procedure(s) were performed by non-physician practitioner and as supervising physician I was immediately available for consultation/collaboration.  Raeford Razor, MD 11/13/11 [phone]

## 2011-12-21 ENCOUNTER — Ambulatory Visit: Payer: Self-pay | Admitting: Physician Assistant

## 2011-12-21 VITALS — BP 104/66 | HR 78 | Temp 98.7°F | Resp 16 | Ht 62.18 in | Wt 130.6 lb

## 2011-12-21 DIAGNOSIS — N390 Urinary tract infection, site not specified: Secondary | ICD-10-CM

## 2011-12-21 DIAGNOSIS — R300 Dysuria: Secondary | ICD-10-CM

## 2011-12-21 LAB — POCT UA - MICROSCOPIC ONLY
Casts, Ur, LPF, POC: NEGATIVE
Crystals, Ur, HPF, POC: NEGATIVE
Yeast, UA: NEGATIVE

## 2011-12-21 LAB — POCT URINALYSIS DIPSTICK
Ketones, UA: 15
pH, UA: 5

## 2011-12-21 MED ORDER — NITROFURANTOIN MONOHYD MACRO 100 MG PO CAPS: 100.0000 mg | ORAL_CAPSULE | Freq: Two times a day (BID) | ORAL | Status: AC

## 2011-12-21 NOTE — Progress Notes (Signed)
Patient ID: [name] MRN: [id number], DOB: 28-May-1986, 28 y.o. Date of Encounter: 12/21/2011, 7:55 PM  Primary Physician: Brock Bad, MD, MD  Chief Complaint: Dysuria, urinary frequency and urgency  HPI: 26 y.o. year old female with history below presents with a 2 day history of dysuria, urinary frequency, and urinary urgency. Afebrile. No chills. No nausea or vomiting. No flank or low back pain. Mild suprapubic fullness, otherwise no abdominal pain. No history of STD's. Sexually active. On birth control pills. Never misses a dose. No vaginal complaints. LMP 12/12/11.   Past Medical History  Diagnosis Date  . No pertinent past medical history   . Postpartum pain      Home Meds: Prior to Admission medications   Medication Sig Start Date End Date Taking? Authorizing Provider  Prenatal Vit-Fe Fumarate-FA (PRENATAL MULTIVITAMIN) TABS Take 1 tablet by mouth daily.   Yes Historical Provider, MD  ibuprofen (ADVIL,MOTRIN) 600 MG tablet Take 1 tablet (600 mg total) by mouth every 6 (six) hours. 10/18/11 10/28/11  Brock Bad, MD  ibuprofen (ADVIL,MOTRIN) 600 MG tablet Take 600 mg by mouth every 6 (six) hours as needed. For pain    Historical Provider, MD  nitrofurantoin, macrocrystal-monohydrate, (MACROBID) 100 MG capsule Take 1 capsule (100 mg total) by mouth 2 (two) times daily. 12/21/11 12/31/11  Sondra Barges, PA-C    Allergies: No Known Allergies  History   Social History  . Marital Status: Married    Spouse Name: N/A    Number of Children: N/A  . Years of Education: N/A   Occupational History  . Not on file.   Social History Main Topics  . Smoking status: Never Smoker   . Smokeless tobacco: Never Used  . Alcohol Use: No  . Drug Use: No  . Sexually Active: Yes   Other Topics Concern  . Not on file   Social History Narrative  . No narrative on file     Review of Systems: Constitutional: negative for chills, fever, night sweats, weight changes, or fatigue    HEENT: negative for vision changes or hearing loss Cardiovascular: negative for chest pain or palpitations Respiratory: negative for hemoptysis, wheezing, shortness of breath, or cough Abdominal: negative for abdominal pain, nausea, vomiting, diarrhea, or constipation Genitourinary: negative for abnormal vaginal bleeding, discharge, burning, pruritis, menopause symptoms, or pelvic pain Dermatological: negative for rash Neurologic: negative for headache, dizziness, or syncope All other systems reviewed and are otherwise negative with the exception to those above and in the HPI.   Physical Exam: Blood pressure 104/66, pulse 78, temperature 98.7 F (37.1 C), temperature source Oral, resp. rate 16, height 5' 2.18" (1.579 m), weight 130 lb 9.6 oz (59.24 kg), last menstrual period 12/12/2011, unknown if currently breastfeeding., Body mass index is 23.75 kg/(m^2). General: Well developed, well nourished, in no acute distress. Head: Normocephalic, atraumatic, eyes without discharge, sclera non-icteric, nares are without discharge. Bilateral auditory canals clear, TM's are without perforation, pearly grey and translucent with reflective cone of light bilaterally. Oral cavity moist, posterior pharynx without exudate, erythema, peritonsillar abscess, or post nasal drip.  Neck: Supple. No thyromegaly. Full ROM. No lymphadenopathy. Lungs: Clear bilaterally to auscultation without wheezes, rales, or rhonchi. Breathing is unlabored. Heart: RRR with S1 S2. No murmurs, rubs, or gallops appreciated. Abdomen: Soft, non-tender, non-distended with normoactive bowel sounds. Mild suprapubic fullness to palpation, creates sensation of needing to void. No hepatosplenomegaly. No rebound/guarding. No obvious abdominal masses. No CVA tenderness. Negative McBurney's, Rovsing's, Iliopsoas, and table  jar test. Msk:  Strength and tone normal for age. Extremities/Skin: Warm and dry. No clubbing or cyanosis. No edema. No rashes  or suspicious lesions. Neuro: Alert and oriented X 3. Moves all extremities spontaneously. Gait is normal. CNII-XII grossly in tact. Psych:  Responds to questions appropriately with a normal affect.   Labs: Results for orders placed in visit on 12/21/11  POCT UA - MICROSCOPIC ONLY      Component Value Range   WBC, Ur, HPF, POC TNTC     RBC, urine, microscopic 15-20     Bacteria, U Microscopic 1+     Mucus, UA neg     Epithelial cells, urine per micros 3-6     Crystals, Ur, HPF, POC neg     Casts, Ur, LPF, POC neg     Yeast, UA neg    POCT URINALYSIS DIPSTICK      Component Value Range   Color, UA orange     Clarity, UA cloudy     Glucose, UA 250     Bilirubin, UA mod     Ketones, UA 15     Spec Grav, UA 1.010     Blood, UA sm     pH, UA 5.0     Protein, UA 300     Urobilinogen, UA >=8.0     Nitrite, UA pos     Leukocytes, UA large (3+)     Urine culture pending  ASSESSMENT AND PLAN:  26 y.o. year old female with UTI. -Macrobid 100 mg #14 1 po bid no RF -Has AZO at home -Push fluids -Await culture results -RTC precautions  Signed, Eula Listen, PA-C 12/21/2011 7:55 PM

## 2011-12-25 LAB — URINE CULTURE

## 2012-09-11 NOTE — L&D Delivery Note (Signed)
Delivery Note At 1:44 AM a viable female was delivered via Vaginal, Spontaneous Delivery (Presentation: Right Occiput Anterior).  APGAR: 8, 9; weight 2 lb 15.3 oz (1341 g).   Placenta status: Intact, Spontaneous.  Cord: 3 vessels with the following complications: None.  Cord pH: 7.40 ( mixed A/V )  Anesthesia: None  Episiotomy: None Lacerations: None Suture Repair: None Est. Blood Loss (mL): 300  Mom to postpartum.  Baby to NICU.  Prematurity.  HARPER,CHARLES A 01/20/2013, 2:35 AM

## 2012-10-22 LAB — OB RESULTS CONSOLE GC/CHLAMYDIA: Gonorrhea: NEGATIVE

## 2012-10-22 LAB — OB RESULTS CONSOLE RUBELLA ANTIBODY, IGM: Rubella: IMMUNE

## 2012-10-22 LAB — OB RESULTS CONSOLE HIV ANTIBODY (ROUTINE TESTING): HIV: NONREACTIVE

## 2012-10-22 LAB — OB RESULTS CONSOLE HGB/HCT, BLOOD: HCT: 34 %

## 2012-10-22 LAB — OB RESULTS CONSOLE RPR: RPR: NONREACTIVE

## 2012-10-22 LAB — OB RESULTS CONSOLE HEPATITIS B SURFACE ANTIGEN: Hepatitis B Surface Ag: NEGATIVE

## 2012-10-22 LAB — OB RESULTS CONSOLE PLATELET COUNT: Platelets: 260 10*3/uL

## 2012-10-29 LAB — US OB DETAIL + 14 WK

## 2012-11-28 ENCOUNTER — Encounter: Payer: Self-pay | Admitting: *Deleted

## 2012-11-29 ENCOUNTER — Encounter: Payer: Self-pay | Admitting: Obstetrics

## 2012-12-12 ENCOUNTER — Encounter: Payer: Self-pay | Admitting: Obstetrics

## 2012-12-12 ENCOUNTER — Encounter: Payer: Self-pay | Admitting: *Deleted

## 2012-12-12 ENCOUNTER — Ambulatory Visit (INDEPENDENT_AMBULATORY_CARE_PROVIDER_SITE_OTHER): Payer: Medicaid Other | Admitting: Obstetrics

## 2012-12-12 VITALS — BP 102/66 | Temp 98.1°F | Wt 128.6 lb

## 2012-12-12 DIAGNOSIS — O365931 Maternal care for other known or suspected poor fetal growth, third trimester, fetus 1: Secondary | ICD-10-CM

## 2012-12-12 DIAGNOSIS — O365990 Maternal care for other known or suspected poor fetal growth, unspecified trimester, not applicable or unspecified: Secondary | ICD-10-CM

## 2012-12-12 DIAGNOSIS — Z3482 Encounter for supervision of other normal pregnancy, second trimester: Secondary | ICD-10-CM

## 2012-12-12 DIAGNOSIS — Z3480 Encounter for supervision of other normal pregnancy, unspecified trimester: Secondary | ICD-10-CM

## 2012-12-12 LAB — POCT URINALYSIS DIPSTICK
Bilirubin, UA: NEGATIVE
Ketones, UA: NEGATIVE
Protein, UA: NEGATIVE
Spec Grav, UA: 1.01
pH, UA: 8

## 2012-12-12 MED ORDER — OB COMPLETE PETITE 35-5-1-200 MG PO CAPS: 1.0000 | ORAL_CAPSULE | Freq: Every day | ORAL | Status: DC

## 2012-12-12 NOTE — Progress Notes (Signed)
No complaints

## 2012-12-12 NOTE — Progress Notes (Signed)
Pulse: 73

## 2012-12-25 ENCOUNTER — Ambulatory Visit (INDEPENDENT_AMBULATORY_CARE_PROVIDER_SITE_OTHER): Payer: Medicaid Other

## 2012-12-25 DIAGNOSIS — O365931 Maternal care for other known or suspected poor fetal growth, third trimester, fetus 1: Secondary | ICD-10-CM

## 2012-12-25 DIAGNOSIS — O365990 Maternal care for other known or suspected poor fetal growth, unspecified trimester, not applicable or unspecified: Secondary | ICD-10-CM

## 2012-12-25 LAB — US OB DETAIL + 14 WK

## 2012-12-26 ENCOUNTER — Encounter: Payer: Self-pay | Admitting: Obstetrics

## 2012-12-26 ENCOUNTER — Ambulatory Visit (INDEPENDENT_AMBULATORY_CARE_PROVIDER_SITE_OTHER): Payer: Medicaid Other | Admitting: Obstetrics

## 2012-12-26 VITALS — BP 104/68 | Temp 98.8°F | Wt 132.0 lb

## 2012-12-26 DIAGNOSIS — Z3480 Encounter for supervision of other normal pregnancy, unspecified trimester: Secondary | ICD-10-CM

## 2012-12-26 DIAGNOSIS — Z3482 Encounter for supervision of other normal pregnancy, second trimester: Secondary | ICD-10-CM

## 2012-12-26 LAB — POCT URINALYSIS DIPSTICK
Blood, UA: NEGATIVE
Ketones, UA: NEGATIVE
Protein, UA: NEGATIVE
Spec Grav, UA: <=1.005
Urobilinogen, UA: NEGATIVE
pH, UA: 7

## 2012-12-26 NOTE — Progress Notes (Signed)
P 79 Pt reports she is doing well at this time with no complaints.

## 2013-01-13 ENCOUNTER — Ambulatory Visit (INDEPENDENT_AMBULATORY_CARE_PROVIDER_SITE_OTHER): Payer: Medicaid Other | Admitting: Obstetrics

## 2013-01-13 ENCOUNTER — Encounter: Payer: Self-pay | Admitting: Obstetrics

## 2013-01-13 VITALS — BP 106/68 | Temp 98.6°F | Wt 136.6 lb

## 2013-01-13 DIAGNOSIS — Z3482 Encounter for supervision of other normal pregnancy, second trimester: Secondary | ICD-10-CM

## 2013-01-13 DIAGNOSIS — Z3480 Encounter for supervision of other normal pregnancy, unspecified trimester: Secondary | ICD-10-CM

## 2013-01-13 DIAGNOSIS — Z3483 Encounter for supervision of other normal pregnancy, third trimester: Secondary | ICD-10-CM

## 2013-01-13 LAB — POCT URINALYSIS DIPSTICK
Glucose, UA: NEGATIVE
Leukocytes, UA: NEGATIVE
Nitrite, UA: NEGATIVE
Protein, UA: NEGATIVE
Spec Grav, UA: <=1.005
Urobilinogen, UA: NEGATIVE

## 2013-01-13 NOTE — Progress Notes (Signed)
Pulse: 82

## 2013-01-19 ENCOUNTER — Inpatient Hospital Stay (HOSPITAL_COMMUNITY)
Admission: AD | Admit: 2013-01-19 | Discharge: 2013-01-22 | DRG: 775 | Disposition: A | Discharge status: Home / Self Care | Payer: Medicaid Other | Source: Ambulatory Visit | Attending: Obstetrics | Admitting: Obstetrics

## 2013-01-19 ENCOUNTER — Encounter (HOSPITAL_COMMUNITY): Payer: Self-pay | Admitting: *Deleted

## 2013-01-19 DIAGNOSIS — O34219 Maternal care for unspecified type scar from previous cesarean delivery: Principal | ICD-10-CM | POA: Diagnosis present

## 2013-01-19 LAB — URINALYSIS, ROUTINE W REFLEX MICROSCOPIC
Glucose, UA: NEGATIVE mg/dL
Ketones, ur: NEGATIVE mg/dL
Leukocytes, UA: NEGATIVE
Protein, ur: NEGATIVE mg/dL
Urobilinogen, UA: 0.2 mg/dL (ref 0.0–1.0)

## 2013-01-19 LAB — WET PREP, GENITAL: Trich, Wet Prep: NONE SEEN

## 2013-01-19 MED ORDER — NIFEDIPINE 10 MG PO CAPS
10.0000 mg | ORAL_CAPSULE | ORAL | Status: AC
  Administered 2013-01-19 (×2): 10 mg via ORAL
  Filled 2013-01-19 (×3): qty 1

## 2013-01-19 MED ORDER — CALCIUM CARBONATE ANTACID 500 MG PO CHEW: 2.0000 | CHEWABLE_TABLET | ORAL | Status: DC | PRN

## 2013-01-19 MED ORDER — LACTATED RINGERS IV BOLUS (SEPSIS)
1000.0000 mL | Freq: Once | INTRAVENOUS | Status: AC
  Administered 2013-01-19: 1000 mL via INTRAVENOUS

## 2013-01-19 MED ORDER — AZITHROMYCIN 500 MG PO TABS
500.0000 mg | ORAL_TABLET | Freq: Every day | ORAL | Status: DC
  Filled 2013-01-19: qty 1

## 2013-01-19 MED ORDER — MAGNESIUM SULFATE BOLUS VIA INFUSION
4.0000 g | Freq: Once | INTRAVENOUS | Status: AC
  Administered 2013-01-19: 4 g via INTRAVENOUS
  Filled 2013-01-19: qty 500

## 2013-01-19 MED ORDER — BETAMETHASONE SOD PHOS & ACET 6 (3-3) MG/ML IJ SUSP
12.0000 mg | INTRAMUSCULAR | Status: DC
  Administered 2013-01-19: 12 mg via INTRAMUSCULAR
  Filled 2013-01-19: qty 2

## 2013-01-19 MED ORDER — PRENATAL MULTIVITAMIN CH: 1.0000 | ORAL_TABLET | Freq: Every day | ORAL | Status: DC

## 2013-01-19 MED ORDER — DOCUSATE SODIUM 100 MG PO CAPS: 100.0000 mg | ORAL_CAPSULE | Freq: Every day | ORAL | Status: DC

## 2013-01-19 MED ORDER — LACTATED RINGERS IV SOLN: INTRAVENOUS | Status: DC

## 2013-01-19 MED ORDER — MAGNESIUM SULFATE 40 G IN LACTATED RINGERS - SIMPLE
2.0000 g/h | INTRAVENOUS | Status: DC
  Filled 2013-01-19: qty 500

## 2013-01-19 MED ORDER — ACETAMINOPHEN 325 MG PO TABS: 650.0000 mg | ORAL_TABLET | ORAL | Status: DC | PRN

## 2013-01-19 MED ORDER — ZOLPIDEM TARTRATE 5 MG PO TABS: 5.0000 mg | ORAL_TABLET | Freq: Every evening | ORAL | Status: DC | PRN

## 2013-01-19 MED ORDER — SODIUM CHLORIDE 0.9 % IV SOLN
3.0000 g | Freq: Four times a day (QID) | INTRAVENOUS | Status: DC
  Administered 2013-01-20: 3 g via INTRAVENOUS
  Filled 2013-01-19 (×4): qty 3

## 2013-01-19 NOTE — H&P (Signed)
[name] is a 27 y.o. female presenting for UC's. Maternal Medical History:  Reason for admission: Contractions.  26 y o [place].  Presents with UC's.  IVF's and Procardia given without success.  Patient admitted for further tocolysis and bedrest.  Contractions: Onset was 3-5 hours ago.   Frequency: regular.   Perceived severity is moderate.    Fetal activity: Perceived fetal activity is normal.   Last perceived fetal movement was within the past hour.    Prenatal complications: no prenatal complications Prenatal Complications - Diabetes: none.    OB History   Grav Para Term Preterm Abortions TAB SAB Ect Mult Living   4 2 1 1 1  1   2      Past Medical History  Diagnosis Date  . No pertinent past medical history   . Postpartum pain    Past Surgical History  Procedure Laterality Date  . Cesarean section     Family History: family history is not on file. Social History:  reports that she has never smoked. She has never used smokeless tobacco. She reports that she does not drink alcohol or use illicit drugs.   Prenatal Transfer Tool  Maternal Diabetes: No Genetic Screening: Normal Maternal Ultrasounds/Referrals: Normal Fetal Ultrasounds or other Referrals:  None Maternal Substance Abuse:  No Significant Maternal Medications:  None Significant Maternal Lab Results:  None Other Comments:  None  Review of Systems  All other systems reviewed and are negative.    Dilation: 2 Effacement (%): 100 Station:  (BBOW) Exam by:: Thressa Sheller CNM Blood pressure 97/49, pulse 88, temperature 98.2 F (36.8 C), temperature source Oral, resp. rate 20, height 5\' 2"  (1.575 m), weight 138 lb (62.596 kg), last menstrual period 06/04/2012, SpO2 100.00%. Maternal Exam:  Uterine Assessment: Contraction strength is moderate.  Contraction frequency is regular.   Abdomen: Patient reports no abdominal tenderness. Fetal presentation: vertex  Introitus: Normal vulva. Normal vagina.   Pelvis: adequate for delivery.   Cervix: Cervix evaluated by digital exam.     Physical Exam  Nursing note and vitals reviewed. Constitutional: She is oriented to person, place, and time. She appears well-developed and well-nourished.  HENT:  Head: Normocephalic and atraumatic.  Eyes: Conjunctivae are normal. Pupils are equal, round, and reactive to light.  Neck: Normal range of motion. Neck supple.  Cardiovascular: Normal rate and regular rhythm.   Respiratory: Effort normal.  GI: Soft.  Genitourinary: Vagina normal and uterus normal.  Musculoskeletal: Normal range of motion.  Neurological: She is alert and oriented to person, place, and time.  Skin: Skin is warm and dry.  Psychiatric: She has a normal mood and affect. Her behavior is normal. Judgment and thought content normal.    Prenatal labs: ABO, Rh:   Antibody:   Rubella: Immune (02/11 0000) RPR: Nonreactive (02/11 0000)  HBsAg: Negative (02/11 0000)  HIV: Non-reactive (02/11 0000)  GBS:     Assessment/Plan: 29 weeks.  PTL.  Admit.  IVF's, Magnesium Sulfate, beta methasone and bedrest.   Jaggar Benko A 01/19/2013, 11:07 PM

## 2013-01-19 NOTE — MAU Provider Note (Signed)
History     CSN: [id number]  Arrival date and time: 01/19/13 9528   First Provider Initiated Contact with Patient 01/19/13 2006      Chief Complaint  Patient presents with  . Contractions   HPI  [name] is a 27 y.o. [id number] at [redacted]w[redacted]d who presents today with UCs since 1800. She is rating them a 7/10, and she is not having to breathe through them. She denies any LOF or VB and confirms fetal movement. She denies any complications with this pregnancy. She has had one full term delivery (C-S for breech) and one pre-term delivery at 36 weeks (VBAC).   Past Medical History  Diagnosis Date  . No pertinent past medical history   . Postpartum pain     Past Surgical History  Procedure Laterality Date  . Cesarean section      History reviewed. No pertinent family history.  History  Substance Use Topics  . Smoking status: Never Smoker   . Smokeless tobacco: Never Used  . Alcohol Use: No    Allergies: No Known Allergies  Prescriptions prior to admission  Medication Sig Dispense Refill  . Prenatal Vit-Fe Fumarate-FA (PRENATAL MULTIVITAMIN) TABS Take 1 tablet by mouth daily.        Review of Systems  Constitutional: Negative for fever.  Eyes: Negative for blurred vision.  Respiratory: Negative for shortness of breath.   Cardiovascular: Negative for chest pain.  Gastrointestinal: Positive for abdominal pain (ctxs). Negative for nausea, vomiting, diarrhea and constipation.  Genitourinary: Negative for dysuria, urgency and frequency.  Musculoskeletal: Negative for myalgias.  Neurological: Negative for dizziness and headaches.   Physical Exam   Blood pressure 116/67, pulse 83, temperature 98.2 F (36.8 C), temperature source Oral, resp. rate 20, height 5\' 2"  (1.575 m), weight 62.596 kg (138 lb), last menstrual period 06/04/2012, SpO2 100.00%.  Physical Exam  Nursing note and vitals reviewed. Constitutional: She is oriented to person, place, and time. She appears  well-developed and well-nourished. No distress.  Cardiovascular: Normal rate.   Respiratory: Effort normal.  GI: Soft.  Genitourinary:   External: no lesion Vagina: small amount of white discharge, no pooling of fluid seen Cervix: pink, parous, smooth. 1-2/50/-2/soft Uterus: AGA  Neurological: She is alert and oriented to person, place, and time.  Skin: Skin is warm and dry.  Psychiatric: She has a normal mood and affect.   FHT: 145, moderate with 15x15 accels, one variable, Cat I Toco: q 5-7 min ctx.   MAU Course  Procedures  Results for orders placed during the hospital encounter of 01/19/13 (from the past 24 hour(s))  URINALYSIS, ROUTINE W REFLEX MICROSCOPIC     Status: Abnormal   Collection Time    01/19/13  7:50 PM      Result Value Range   Color, Urine YELLOW  YELLOW   APPearance CLEAR  CLEAR   Specific Gravity, Urine <1.005 (*) 1.005 - 1.030   pH 6.0  5.0 - 8.0   Glucose, UA NEGATIVE  NEGATIVE mg/dL   Hgb urine dipstick NEGATIVE  NEGATIVE   Bilirubin Urine NEGATIVE  NEGATIVE   Ketones, ur NEGATIVE  NEGATIVE mg/dL   Protein, ur NEGATIVE  NEGATIVE mg/dL   Urobilinogen, UA 0.2  0.0 - 1.0 mg/dL   Nitrite NEGATIVE  NEGATIVE   Leukocytes, UA NEGATIVE  NEGATIVE  FETAL FIBRONECTIN     Status: None   Collection Time    01/19/13  8:16 PM      Result Value Range  Fetal Fibronectin NEGATIVE  NEGATIVE  WET PREP, GENITAL     Status: Abnormal   Collection Time    01/19/13  8:16 PM      Result Value Range   Yeast Wet Prep HPF POC NONE SEEN  NONE SEEN   Trich, Wet Prep NONE SEEN  NONE SEEN   Clue Cells Wet Prep HPF POC NONE SEEN  NONE SEEN   WBC, Wet Prep HPF POC FEW (*) NONE SEEN     2020: Spoke with Dr. Clearance Coots: send FFN and wet prep. Give LR bolus and procardia.  2210: Cervical change 2/100/BBOW. Dr. Clearance Coots notified, orders for admission given  Assessment and Plan  Preterm labor Admit to ante Mag  BMZ ABX  Tawnya Crook 01/19/2013, 8:20 PM

## 2013-01-19 NOTE — MAU Note (Signed)
Hogan CNM notified patient BP 97/49 after two doses of Procardia. Told to hold the third dose for now. Will continue to monitor contractions.

## 2013-01-19 NOTE — Plan of Care (Signed)
Problem: Consults Goal: Birthing Suites Patient Information Press F2 to bring up selections list   Pt < [redacted] weeks EGA     

## 2013-01-19 NOTE — MAU Note (Signed)
Pt states she has been contracting Q 10 min since 1800

## 2013-01-20 ENCOUNTER — Encounter (HOSPITAL_COMMUNITY): Payer: Self-pay | Admitting: *Deleted

## 2013-01-20 LAB — CBC
Hemoglobin: 12.3 g/dL (ref 12.0–15.0)
MCH: 31.7 pg (ref 26.0–34.0)
MCV: 94.7 fL (ref 78.0–100.0)
Platelets: 260 10*3/uL (ref 150–400)
RBC: 3.75 MIL/uL — ABNORMAL LOW (ref 3.87–5.11)
RBC: 3.88 MIL/uL (ref 3.87–5.11)
WBC: 8.9 10*3/uL (ref 4.0–10.5)

## 2013-01-20 LAB — ABO/RH: ABO/RH(D): O POS

## 2013-01-20 LAB — TYPE AND SCREEN
ABO/RH(D): O POS
Antibody Screen: NEGATIVE

## 2013-01-20 MED ORDER — LANOLIN HYDROUS EX OINT: TOPICAL_OINTMENT | CUTANEOUS | Status: DC | PRN

## 2013-01-20 MED ORDER — FLEET ENEMA 7-19 GM/118ML RE ENEM: 1.0000 | ENEMA | RECTAL | Status: DC | PRN

## 2013-01-20 MED ORDER — WITCH HAZEL-GLYCERIN EX PADS: 1.0000 "application " | MEDICATED_PAD | CUTANEOUS | Status: DC | PRN

## 2013-01-20 MED ORDER — ONDANSETRON HCL 4 MG/2ML IJ SOLN: 4.0000 mg | INTRAMUSCULAR | Status: DC | PRN

## 2013-01-20 MED ORDER — OXYTOCIN 40 UNITS IN LACTATED RINGERS INFUSION - SIMPLE MED: 62.5000 mL/h | INTRAVENOUS | Status: DC | PRN

## 2013-01-20 MED ORDER — LACTATED RINGERS IV SOLN: INTRAVENOUS | Status: DC

## 2013-01-20 MED ORDER — OXYCODONE-ACETAMINOPHEN 5-325 MG PO TABS: 1.0000 | ORAL_TABLET | ORAL | Status: DC | PRN

## 2013-01-20 MED ORDER — OXYTOCIN 40 UNITS IN LACTATED RINGERS INFUSION - SIMPLE MED
INTRAVENOUS | Status: AC
  Filled 2013-01-20: qty 1000

## 2013-01-20 MED ORDER — SIMETHICONE 80 MG PO CHEW: 80.0000 mg | CHEWABLE_TABLET | ORAL | Status: DC | PRN

## 2013-01-20 MED ORDER — SENNOSIDES-DOCUSATE SODIUM 8.6-50 MG PO TABS
2.0000 | ORAL_TABLET | Freq: Every day | ORAL | Status: DC
  Administered 2013-01-20 – 2013-01-21 (×2): 2 via ORAL

## 2013-01-20 MED ORDER — DIBUCAINE 1 % RE OINT: 1.0000 "application " | TOPICAL_OINTMENT | RECTAL | Status: DC | PRN

## 2013-01-20 MED ORDER — ONDANSETRON HCL 4 MG/2ML IJ SOLN: 4.0000 mg | Freq: Four times a day (QID) | INTRAMUSCULAR | Status: DC | PRN

## 2013-01-20 MED ORDER — LIDOCAINE HCL (PF) 1 % IJ SOLN
INTRAMUSCULAR | Status: AC
  Filled 2013-01-20: qty 30

## 2013-01-20 MED ORDER — ONDANSETRON HCL 4 MG PO TABS: 4.0000 mg | ORAL_TABLET | ORAL | Status: DC | PRN

## 2013-01-20 MED ORDER — TETANUS-DIPHTH-ACELL PERTUSSIS 5-2.5-18.5 LF-MCG/0.5 IM SUSP: 0.5000 mL | Freq: Once | INTRAMUSCULAR | Status: DC

## 2013-01-20 MED ORDER — PRENATAL MULTIVITAMIN CH
1.0000 | ORAL_TABLET | Freq: Every day | ORAL | Status: DC
  Administered 2013-01-20 – 2013-01-21 (×2): 1 via ORAL
  Filled 2013-01-20 (×2): qty 1

## 2013-01-20 MED ORDER — CITRIC ACID-SODIUM CITRATE 334-500 MG/5ML PO SOLN: 30.0000 mL | ORAL | Status: DC | PRN

## 2013-01-20 MED ORDER — IBUPROFEN 600 MG PO TABS
600.0000 mg | ORAL_TABLET | Freq: Four times a day (QID) | ORAL | Status: DC
  Administered 2013-01-20 – 2013-01-22 (×8): 600 mg via ORAL
  Filled 2013-01-20 (×8): qty 1

## 2013-01-20 MED ORDER — IBUPROFEN 600 MG PO TABS
600.0000 mg | ORAL_TABLET | Freq: Four times a day (QID) | ORAL | Status: DC | PRN
  Administered 2013-01-20: 600 mg via ORAL
  Filled 2013-01-20: qty 1

## 2013-01-20 MED ORDER — LACTATED RINGERS IV SOLN: 500.0000 mL | INTRAVENOUS | Status: DC | PRN

## 2013-01-20 MED ORDER — OXYTOCIN 10 UNIT/ML IJ SOLN
INTRAMUSCULAR | Status: AC
  Administered 2013-01-20: 10 [IU]
  Filled 2013-01-20: qty 2

## 2013-01-20 MED ORDER — ZOLPIDEM TARTRATE 5 MG PO TABS: 5.0000 mg | ORAL_TABLET | Freq: Every evening | ORAL | Status: DC | PRN

## 2013-01-20 MED ORDER — BENZOCAINE-MENTHOL 20-0.5 % EX AERO
1.0000 "application " | INHALATION_SPRAY | CUTANEOUS | Status: DC | PRN
  Filled 2013-01-20: qty 56

## 2013-01-20 MED ORDER — ACETAMINOPHEN 325 MG PO TABS: 650.0000 mg | ORAL_TABLET | ORAL | Status: DC | PRN

## 2013-01-20 MED ORDER — DIPHENHYDRAMINE HCL 25 MG PO CAPS: 25.0000 mg | ORAL_CAPSULE | Freq: Four times a day (QID) | ORAL | Status: DC | PRN

## 2013-01-20 MED ORDER — LIDOCAINE HCL (PF) 1 % IJ SOLN
30.0000 mL | INTRAMUSCULAR | Status: DC | PRN
  Filled 2013-01-20: qty 30

## 2013-01-20 MED ORDER — OXYTOCIN 40 UNITS IN LACTATED RINGERS INFUSION - SIMPLE MED: 62.5000 mL/h | INTRAVENOUS | Status: DC

## 2013-01-20 MED ORDER — OXYTOCIN BOLUS FROM INFUSION
500.0000 mL | INTRAVENOUS | Status: DC
  Administered 2013-01-20: 500 mL via INTRAVENOUS

## 2013-01-20 NOTE — Progress Notes (Signed)
[name] is [place] 27 y.o. [phone] at 108w1d by LMP admitted for Preterm labor  Subjective:   Objective: BP 109/55  Pulse 83  Temp(Src) 98.2 F (36.8 C) (Oral)  Resp 20  Ht 5\' 2"  (1.575 m)  Wt 138 lb (62.596 kg)  BMI 25.23 kg/m2  SpO2 100%  LMP 06/04/2012   Total I/O In: 32.5 [I.V.:32.5] Out: 300 [Blood:300]  FHT:  FHR: 150 bpm, variability: moderate,  accelerations:  Present,  decelerations:  Absent UC:   regular, every 2 minutes SVE:   Dilation: 10 Effacement (%): 100 Station: 0 Exam by:: dr [name]  Labs: Lab Results  Component Value Date   WBC 8.9 01/19/2013   HGB 11.9* 01/19/2013   HCT 35.5* 01/19/2013   MCV 94.7 01/19/2013   PLT 260 01/19/2013    Assessment / Plan: 29 weeks.  Active labor.  Expectant.  Labor: Active Preeclampsia:  n/[place] Fetal Wellbeing:  Category I Pain Control:  Labor support without medications I/D:  n/[place] Anticipated MOD:  NSVD  [name] 01/20/2013, 2:32 AM

## 2013-01-20 NOTE — Progress Notes (Signed)
Ur chart review completed.  

## 2013-01-20 NOTE — Progress Notes (Signed)
Post Partum Day 0 Subjective: no complaints  Objective: Blood pressure 100/62, pulse 80, temperature 98.6 F (37 C), temperature source Oral, resp. rate 15, height 5\' 2"  (1.575 m), weight 138 lb (62.596 kg), last menstrual period 06/04/2012, SpO2 98.00%, unknown if currently breastfeeding.  Physical Exam:  General: alert and no distress Lochia: appropriate Uterine Fundus: firm Incision: none DVT Evaluation: No evidence of DVT seen on physical exam.   Recent Labs  01/19/13 2037  HGB 11.9*  HCT 35.5*    Assessment/Plan: Plan for discharge tomorrow   LOS: 1 day   HARPER,CHARLES A 01/20/2013, 6:23 AM

## 2013-01-20 NOTE — Plan of Care (Signed)
Problem: Consults Goal: Postpartum Patient Education (See Patient Education module for education specifics.)  Outcome: Completed/Met Date Met:  01/20/13 Fall Prevention Purple Cry Video Baby and me  Book Patient care guide  Problem: Phase I Progression Outcomes Goal: Pain controlled with appropriate interventions Outcome: Completed/Met Date Met:  01/20/13 Good pain control on Motrin so far Goal: Voiding adequately Outcome: Completed/Met Date Met:  01/20/13 Voided qs amount Goal: OOB as tolerated unless otherwise ordered Outcome: Completed/Met Date Met:  01/20/13 Up for peri care and tolerated well Goal: Initial discharge plan identified Outcome: Completed/Met Date Met:  01/20/13 VSS  Pain controlled Understands self care Understands when to call the MD

## 2013-01-21 NOTE — Lactation Note (Signed)
This note was copied from the chart of [name]. Lactation Consultation Note Initial consultation for this experienced mom; baby is in NICU, mom states she breast fed her first 2 children without difficulty. Mom has a DEBP set up in the room. Mom states she is pumping every 3 hours but has not got any colostrum out yet. Inst mom to follow pumping with 3 to 4 minutes of hand expression. Inst mom to call for help if needed. Questions answered.   Patient Name: [name] [id number] Date: 01/21/2013 Reason for consult: NICU baby;Follow-up assessment   Maternal Data Formula Feeding for Exclusion: Yes (baby in NICU) Infant to breast within first hour of birth: No Breastfeeding delayed due to:: Infant status Has patient been taught Hand Expression?: Yes Does the patient have breastfeeding experience prior to this delivery?: Yes  Feeding Feeding Type: Breast Milk Feeding method: Tube/Gavage Length of feed:  (15 min gavage)  LATCH Score/Interventions                      Lactation Tools Discussed/Used Tools: Pump Breast pump type: Double-Electric Breast Pump WIC Program: Yes (mom has appointment f ro tomorrow to get DEP) Pump Review: Setup, frequency, and cleaning;Milk Storage;Other (comment) (premie setting, hand expression, NICU booklet on exp Bm) Initiated by:: bedside rn within 6 hours of delivery Date initiated:: 01/20/13   Consult Status Consult Status: Follow-up Date: 01/22/13 Follow-up type: In-patient    Octavio Manns [facility] 01/21/2013, 3:39 PM

## 2013-01-21 NOTE — Progress Notes (Signed)
Post Partum Day 1 Subjective: no complaints  Objective: Blood pressure 105/57, pulse 73, temperature 98 F (36.7 C), temperature source Oral, resp. rate 18, height 5\' 2"  (1.575 m), weight 138 lb (62.596 kg), last menstrual period 06/04/2012, SpO2 98.00%, unknown if currently breastfeeding.  Physical Exam:  General: alert and no distress Lochia: appropriate Uterine Fundus: firm Incision: healing well DVT Evaluation: No evidence of DVT seen on physical exam.   Recent Labs  01/19/13 2037 01/20/13 0620  HGB 11.9* 12.3  HCT 35.5* 36.2    Assessment/Plan: Plan for discharge tomorrow   LOS: 2 days   HARPER,CHARLES A 01/21/2013, 10:29 PM

## 2013-01-22 MED ORDER — NORETHINDRONE 0.35 MG PO TABS: 1.0000 | ORAL_TABLET | Freq: Every day | ORAL | Status: DC

## 2013-01-22 NOTE — Discharge Instructions (Signed)

## 2013-01-22 NOTE — Discharge Summary (Signed)
  Obstetric Discharge Summary Reason for Admission: preterm labor Prenatal Procedures: tocolysis Intrapartum Procedures: spontaneous vaginal delivery Postpartum Procedures: none Complications-Operative and Postpartum: none  Hemoglobin  Date Value Range Status  01/20/2013 12.3  12.0 - 15.0 g/dL Final  03/01/3085 57.8   Final     HCT  Date Value Range Status  01/20/2013 36.2  36.0 - 46.0 % Final  10/22/2012 34   Final    Physical Exam:  General: alert Lochia: appropriate Uterine: firm Incision: n/a DVT Evaluation: No evidence of DVT seen on physical exam.  Discharge Diagnoses: Active Problems:   Preterm delivery, delivered   Discharge Information: Date: 01/22/2013 Activity: pelvic rest Diet: routine Medications:  Prior to Admission medications   Medication Sig Start Date End Date Taking? Authorizing Provider  Prenatal Vit-Fe Fumarate-FA (PRENATAL MULTIVITAMIN) TABS Take 1 tablet by mouth daily.   Yes Historical Provider, MD  norethindrone (ORTHO MICRONOR) 0.35 MG tablet Take 1 tablet (0.35 mg total) by mouth daily. 2nd Sunday start 01/22/13   Antionette Char, MD    Condition: stable Instructions: refer to routine discharge instructions Discharge to: home Follow-up Information   Follow up with HARPER,CHARLES A, MD. Schedule an appointment as soon as possible for a visit in 2 weeks.   Contact information:   [address] [place] [address] [phone]       Newborn Data: Live born  Information for the patient's newborn:  Amarii, Amy [[id number]]  female  ; APGAR (1 MIN): 8   APGAR (5 MINS): 9     JACKSON-MOORE,Neelam Tiggs A 01/22/2013, 9:52 AM

## 2013-01-22 NOTE — Progress Notes (Signed)
Discharge instructions reviewed with patient.  Patient states understanding of home care, medications, activity, signs/symptoms to report to MD and follow up MD office visit.  Patient ambulated with staff for discharge in stable condition without incident.

## 2013-01-27 ENCOUNTER — Encounter: Payer: Medicaid Other | Admitting: Obstetrics

## 2013-02-13 ENCOUNTER — Ambulatory Visit (INDEPENDENT_AMBULATORY_CARE_PROVIDER_SITE_OTHER): Payer: Medicaid Other | Admitting: Obstetrics

## 2013-02-13 ENCOUNTER — Encounter: Payer: Self-pay | Admitting: Obstetrics

## 2013-02-13 VITALS — BP 100/65 | HR 78 | Temp 98.4°F | Wt 126.0 lb

## 2013-02-13 DIAGNOSIS — Z3009 Encounter for other general counseling and advice on contraception: Secondary | ICD-10-CM

## 2013-02-13 MED ORDER — NORETHINDRONE 0.35 MG PO TABS: 1.0000 | ORAL_TABLET | Freq: Every day | ORAL | Status: AC

## 2013-02-13 NOTE — Progress Notes (Signed)
Subjective:     [name] is a 27 y.o. female who presents for a postpartum visit. She is 3 weeks postpartum following a spontaneous vaginal delivery. I have fully reviewed the prenatal and intrapartum course. The delivery was at 29 gestational weeks. Outcome: spontaneous vaginal delivery. Anesthesia: none. Postpartum course has been normal. Baby's course has been in NICU. Baby is feeding by breast. Bleeding pink. Bowel function is normal. Bladder function is normal. Patient is not sexually active. Contraception method is Pharmacist, hospital. Postpartum depression screening: negative.  The following portions of the patient's history were reviewed and updated as appropriate: allergies, current medications, past family history, past medical history, past social history, past surgical history and problem list.  Review of Systems Pertinent items are noted in HPI.   Objective:    BP 100/65  Pulse 78  Temp(Src) 98.4 F (36.9 C) (Oral)  Wt 126 lb (57.153 kg)  BMI 23.04 kg/m2  Breastfeeding? Yes  General:  alert and no distress   Breasts:  inspection negative, no nipple discharge or bleeding, no masses or nodularity palpable  Lungs: not examined  Heart:  not examined  Abdomen: normal findings: soft, non-tender   Vulva:  normal  Vagina: normal vagina  Cervix:  no lesions  Corpus: normal size, contour, position, consistency, mobility, non-tender  Adnexa:  no mass, fullness, tenderness  Rectal Exam: Not performed.        Assessment:     Normal postpartum exam. Pap smear not done at today's visit.   Plan:    1. Contraception: None.  Wants OCP. 2. Micronor Rx 3. Follow up in: several months or as needed.

## 2014-07-13 ENCOUNTER — Encounter: Payer: Self-pay | Admitting: Obstetrics

## 2015-07-09 LAB — GLUCOSE, POCT (MANUAL RESULT ENTRY): POC Glucose: 110 mg/dl — AB (ref 70–99)

## 2020-11-24 ENCOUNTER — Other Ambulatory Visit: Payer: Self-pay

## 2020-11-24 DIAGNOSIS — N644 Mastodynia: Secondary | ICD-10-CM

## 2020-11-30 ENCOUNTER — Other Ambulatory Visit: Payer: Self-pay

## 2020-11-30 ENCOUNTER — Ambulatory Visit: Payer: Self-pay | Admitting: *Deleted

## 2020-11-30 VITALS — BP 108/72 | Wt 118.2 lb

## 2020-11-30 DIAGNOSIS — N644 Mastodynia: Secondary | ICD-10-CM

## 2020-11-30 DIAGNOSIS — Z1239 Encounter for other screening for malignant neoplasm of breast: Secondary | ICD-10-CM

## 2020-11-30 NOTE — Progress Notes (Signed)
Ms. [name] is a 35 y.o. female who presents to [facility] clinic today with complaint of bilateral breast tenderness x 6 months that comes and goes. Patient states the pain comes and goes. Patient states the pain occurs at least two weeks each month. Patient unsure if it is around her menstrual period. Patient rates the pain at a 5 out of 10.    Pap Smear: Pap smear not completed today. Last Pap smear was 07/28/2020 at the [facility] clinic and was normal with negative HPV. Per patient has no history of an abnormal Pap smear. Last Pap smear result is available in Epic.   Physical exam: Breasts Breasts symmetrical. No skin abnormalities bilateral breasts. No nipple retraction bilateral breasts. No nipple discharge bilateral breasts. No lymphadenopathy. No lumps palpated bilateral breasts. Complaints of left upper outer breast pain on exam.     Pelvic/Bimanual Pap is not indicated today per BCCCP guidelines.   Smoking History: Patient has never smoked.   Patient Navigation: Patient education provided. Access to services provided for patient through BCCCP program.    Breast and Cervical Cancer Risk Assessment: Patient does not have family history of breast cancer, known genetic mutations, or radiation treatment to the chest before age 29. Patient does not have history of cervical dysplasia, immunocompromised, or DES exposure in-utero.  Risk Assessment    Risk Scores      11/30/2020   Last edited by: Priscille Heidelberg, RN   5-year risk: 0.2 %   Lifetime risk: 6.5 %          A: BCCCP exam without pap smear Complaint of bilateral breast tenderness.  P: Referred patient to the [facility] for a diagnostic mammogram. Appointment scheduled Thursday, December 02, 2020 at 0910.  Priscille Heidelberg, RN 11/30/2020 9:46 AM

## 2020-11-30 NOTE — Patient Instructions (Signed)
Explained breast self awareness with Mitchel Honour. Patient did not need a Pap smear today due to last Pap smear and HPV typing was 07/28/2020. Let her know BCCCP will cover Pap smears and HPV typing every 5 years unless has a history of abnormal Pap smears. Referred patient to the [facility] for a diagnostic mammogram. Appointment scheduled Thursday, December 02, 2020 at 0910. Patient aware of appointment and will be there. [place] Silos Rosasco verbalized understanding.  Margarita Bobrowski, Kathaleen Maser, RN 9:46 AM

## 2020-12-02 ENCOUNTER — Other Ambulatory Visit: Payer: Self-pay

## 2020-12-02 ENCOUNTER — Ambulatory Visit
Admission: RE | Admit: 2020-12-02 | Discharge: 2020-12-02 | Disposition: A | Discharge status: Home / Self Care | Payer: No Typology Code available for payment source | Source: Ambulatory Visit | Attending: Obstetrics and Gynecology | Admitting: Obstetrics and Gynecology

## 2020-12-02 ENCOUNTER — Ambulatory Visit: Payer: Self-pay

## 2020-12-02 DIAGNOSIS — N644 Mastodynia: Secondary | ICD-10-CM

## 2022-05-06 IMAGING — MG DIGITAL DIAGNOSTIC BILAT W/ TOMO W/ CAD
8 series · 9 of 24 positions shown · non-contrast
Comparison: None.

CLINICAL DATA: Intermittent diffuse bilateral breast pain and
tenderness the past 8 months. No family history of breast cancer.

EXAM:
DIGITAL DIAGNOSTIC BILATERAL MAMMOGRAM WITH TOMOSYNTHESIS AND CAD
TECHNIQUE: Bilateral digital diagnostic mammography and breast tomosynthesis
was performed. The images were evaluated with computer-aided
detection.

[L MLO synth-2D]
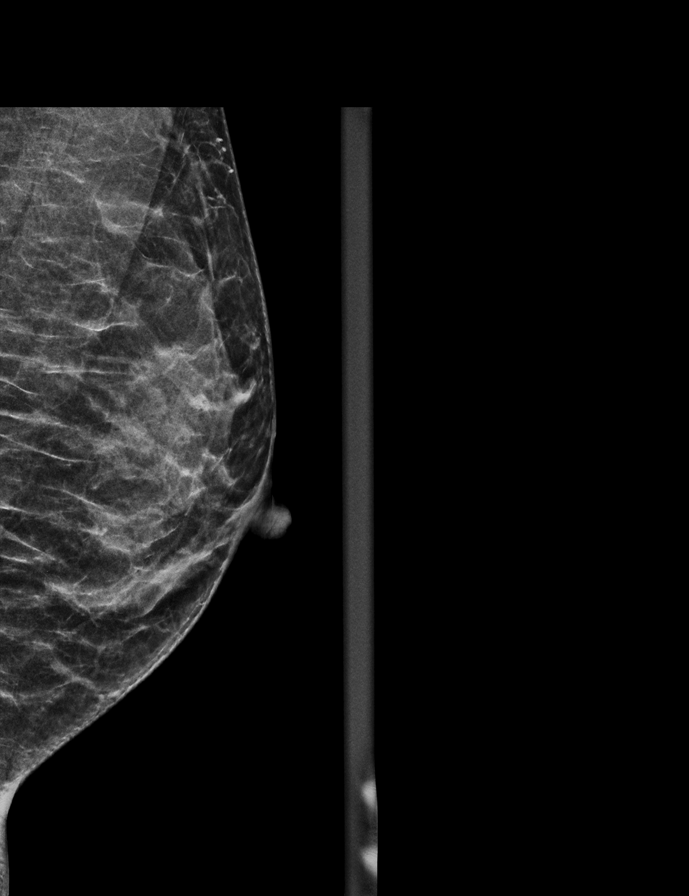

[R MLO synth-2D]
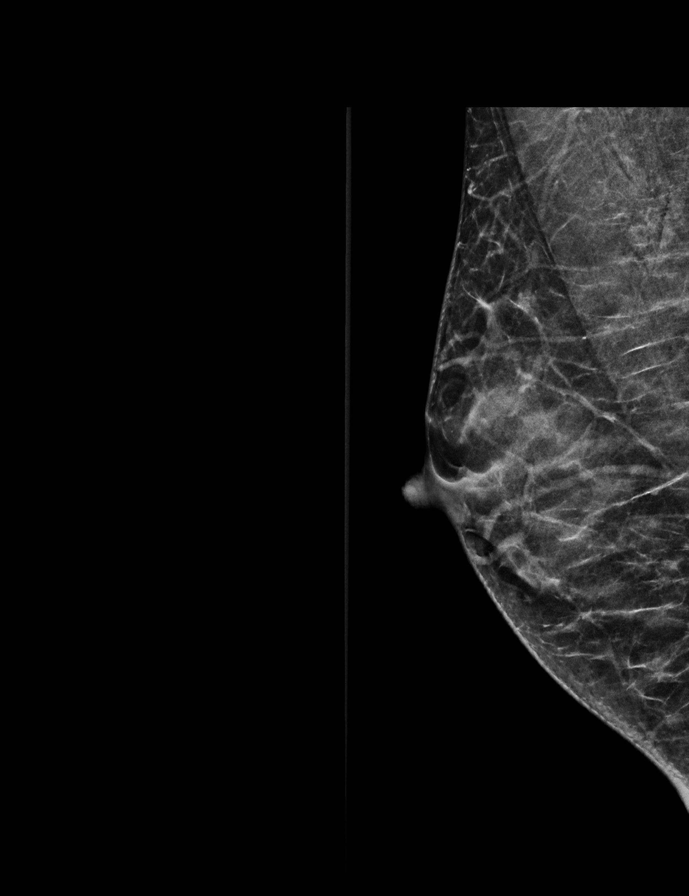

[L CC synth-2D]
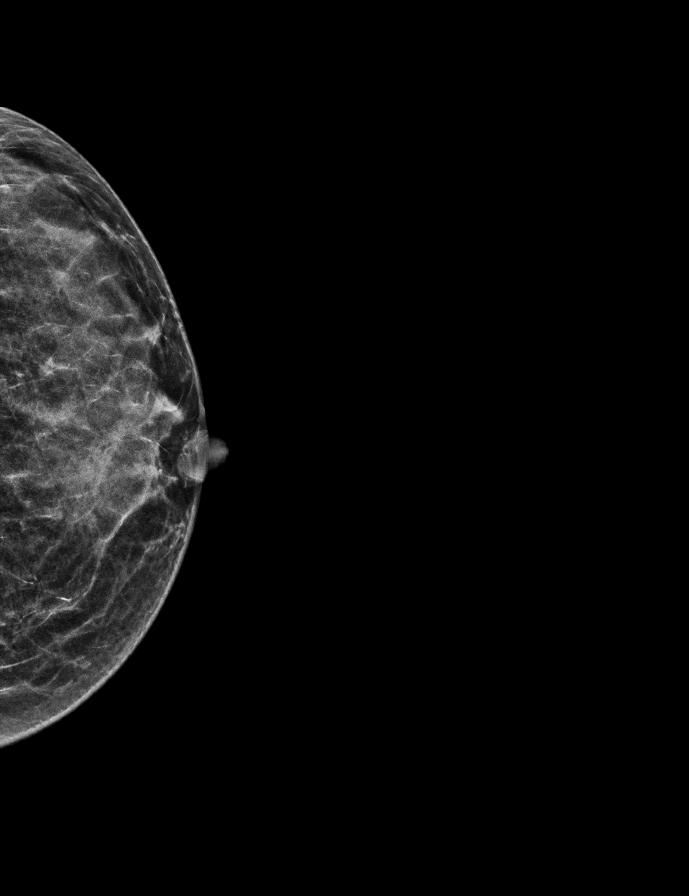

[R CC synth-2D]
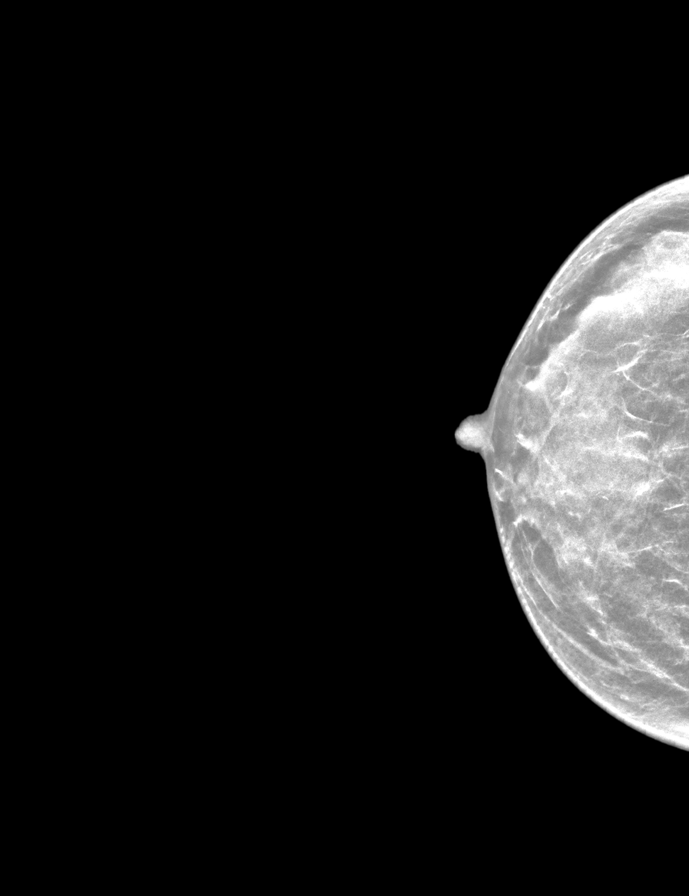

[R CC tomo · 2 of 41 frames shown]
[frame 14/41]
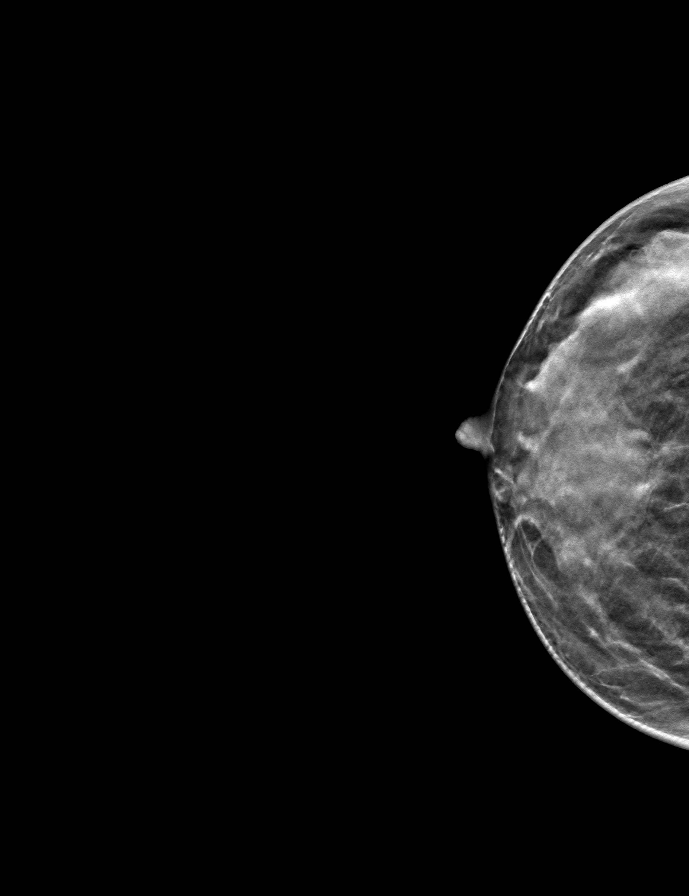
[frame 21/41]
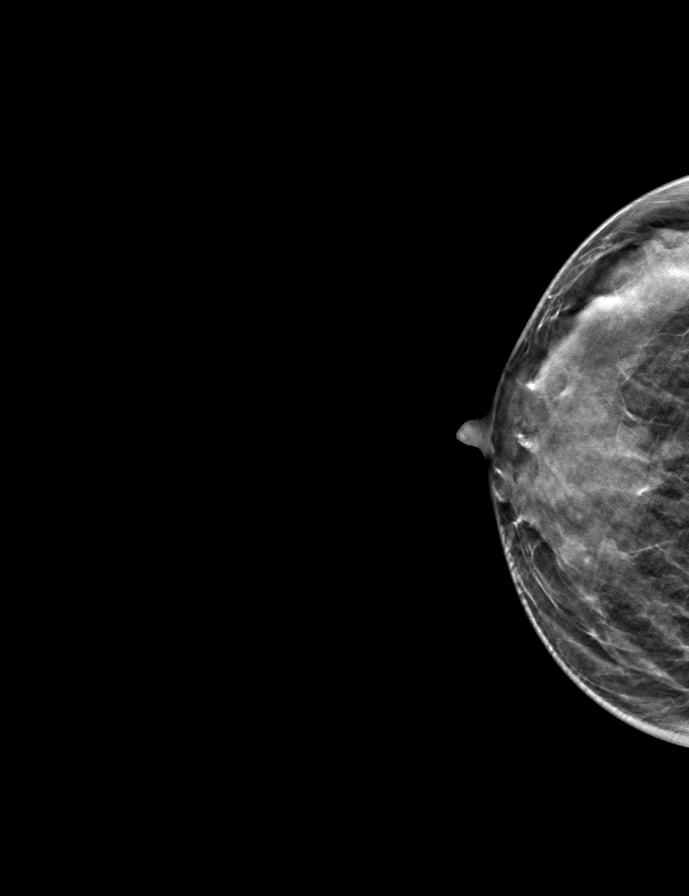

[L CC tomo · tomo slice 19/38.0]
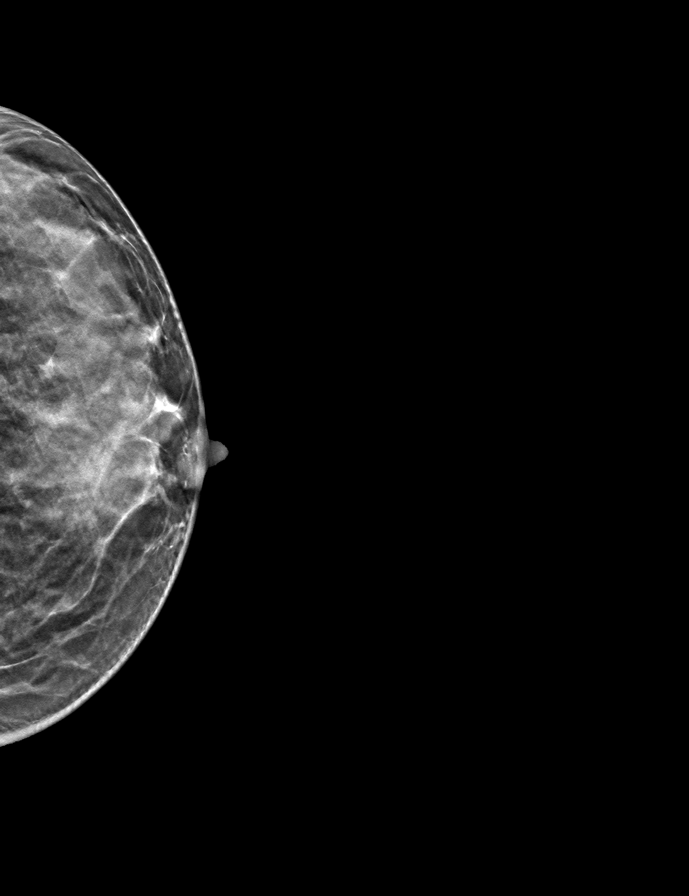

[R MLO tomo · tomo slice 21/42.0]
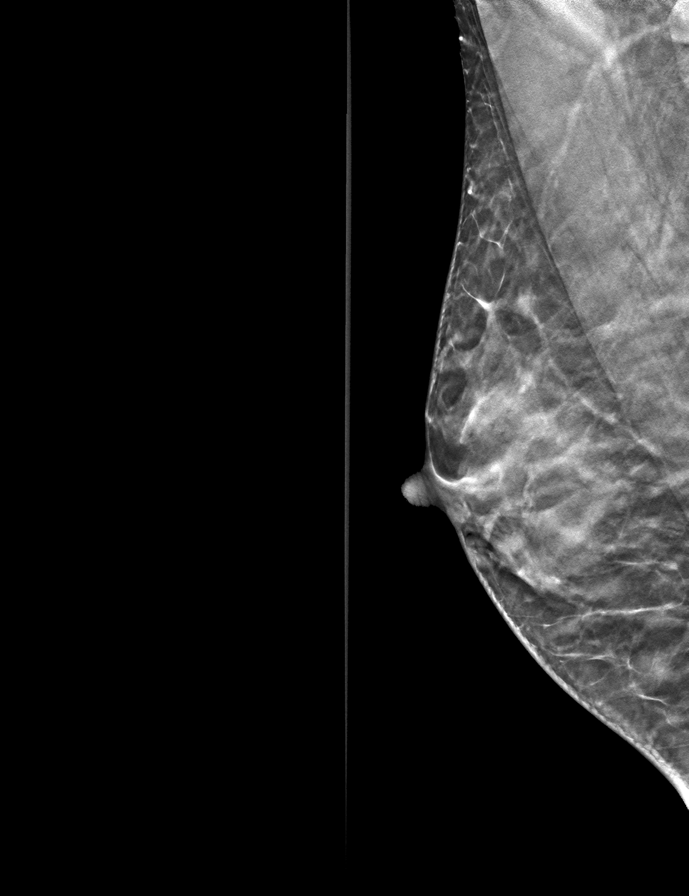

[L MLO tomo · tomo slice 22/43.0]
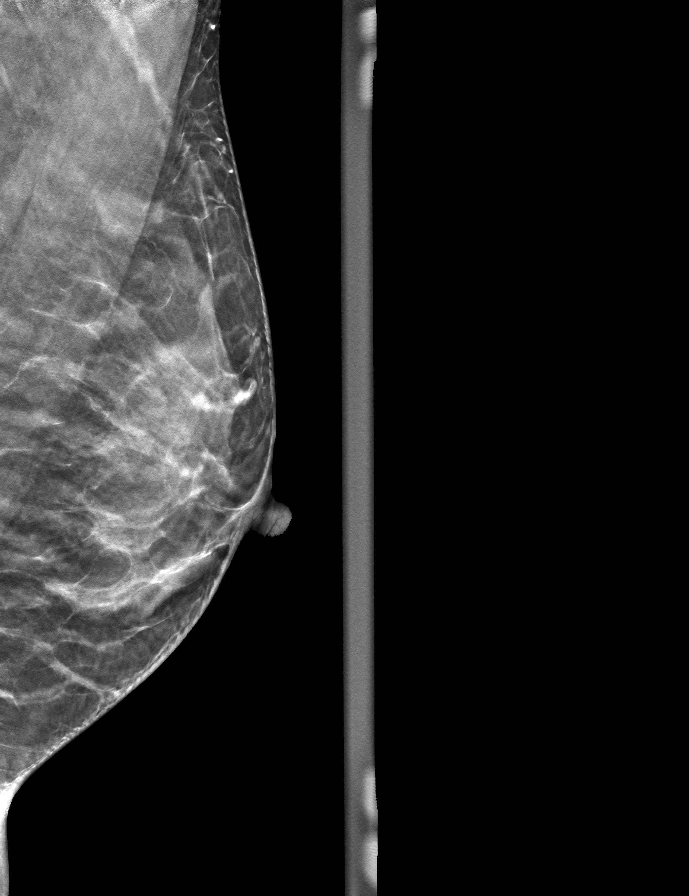

[9 of 24 positions shown; findings below may reference images not displayed]

Baseline.

ACR Breast Density Category c: The breast tissue is heterogeneously
dense, which may obscure small masses.
FINDINGS: Mammographically normal appearing breasts with no findings
suspicious for malignancy in either breast.
IMPRESSION: No evidence of malignancy.

RECOMMENDATION:
Annual screening mammography beginning at age 40.

I have discussed the findings and recommendations with the patient.
If applicable, a reminder letter will be sent to the patient
regarding the next appointment.

BI-RADS CATEGORY  1: Negative.

## 2024-10-06 ENCOUNTER — Emergency Department (HOSPITAL_COMMUNITY)
Admission: EM | Admit: 2024-10-06 | Discharge: 2024-10-06 | Disposition: A | Discharge status: Home / Self Care | Payer: Self-pay | Attending: Emergency Medicine | Admitting: Emergency Medicine

## 2024-10-06 ENCOUNTER — Other Ambulatory Visit: Payer: Self-pay

## 2024-10-06 DIAGNOSIS — R519 Headache, unspecified: Secondary | ICD-10-CM | POA: Insufficient documentation

## 2024-10-06 LAB — CBC WITH DIFFERENTIAL/PLATELET
Abs Immature Granulocytes: 0.02 10*3/uL (ref 0.00–0.07)
Basophils Absolute: 0 10*3/uL (ref 0.0–0.1)
Basophils Relative: 1 %
Eosinophils Absolute: 0.4 10*3/uL (ref 0.0–0.5)
Eosinophils Relative: 5 %
HCT: 37 % (ref 36.0–46.0)
Hemoglobin: 12.5 g/dL (ref 12.0–15.0)
Immature Granulocytes: 0 %
Lymphocytes Relative: 25 %
Lymphs Abs: 1.9 10*3/uL (ref 0.7–4.0)
MCH: 31 pg (ref 26.0–34.0)
MCHC: 33.8 g/dL (ref 30.0–36.0)
MCV: 91.8 fL (ref 80.0–100.0)
Monocytes Absolute: 0.6 10*3/uL (ref 0.1–1.0)
Monocytes Relative: 8 %
Neutro Abs: 4.7 10*3/uL (ref 1.7–7.7)
Neutrophils Relative %: 61 %
Platelets: 250 10*3/uL (ref 150–400)
RBC: 4.03 MIL/uL (ref 3.87–5.11)
RDW: 12.4 % (ref 11.5–15.5)
WBC: 7.7 10*3/uL (ref 4.0–10.5)
nRBC: 0 % (ref 0.0–0.2)

## 2024-10-06 LAB — BASIC METABOLIC PANEL WITH GFR
Anion gap: 9 (ref 5–15)
BUN: 12 mg/dL (ref 6–20)
CO2: 26 mmol/L (ref 22–32)
Calcium: 8.7 mg/dL — ABNORMAL LOW (ref 8.9–10.3)
Chloride: 103 mmol/L (ref 98–111)
Creatinine, Ser: 0.67 mg/dL (ref 0.44–1.00)
GFR, Estimated: >60 mL/min
Glucose, Bld: 98 mg/dL (ref 70–99)
Potassium: 3.7 mmol/L (ref 3.5–5.1)
Sodium: 138 mmol/L (ref 135–145)

## 2024-10-06 LAB — HCG, SERUM, QUALITATIVE: Preg, Serum: NEGATIVE

## 2024-10-06 MED ORDER — PROCHLORPERAZINE EDISYLATE 10 MG/2ML IJ SOLN
10.0000 mg | Freq: Once | INTRAMUSCULAR | Status: AC
  Administered 2024-10-06: 10 mg via INTRAVENOUS
  Filled 2024-10-06: qty 2

## 2024-10-06 MED ORDER — DIPHENHYDRAMINE HCL 50 MG/ML IJ SOLN
25.0000 mg | Freq: Once | INTRAMUSCULAR | Status: AC
  Administered 2024-10-06: 25 mg via INTRAVENOUS
  Filled 2024-10-06: qty 1

## 2024-10-06 MED ORDER — KETOROLAC TROMETHAMINE 15 MG/ML IJ SOLN
15.0000 mg | Freq: Once | INTRAMUSCULAR | Status: AC
  Administered 2024-10-06: 15 mg via INTRAVENOUS
  Filled 2024-10-06: qty 1

## 2024-10-06 MED ORDER — DEXAMETHASONE SOD PHOSPHATE PF 10 MG/ML IJ SOLN
10.0000 mg | Freq: Once | INTRAMUSCULAR | Status: AC
  Administered 2024-10-06: 10 mg via INTRAVENOUS
  Filled 2024-10-06: qty 1

## 2024-10-06 NOTE — ED Triage Notes (Signed)
 Patient complains of R posterior headache x 8 days, unrelieved by OTC meds. Denies falling/hitting head. Denies dizziness or vision changes. Endorses mild photophobia.

## 2024-10-06 NOTE — ED Provider Notes (Signed)
 " [facility] Provider Note   CSN: [id number] Arrival date & time: 10/06/24  1517     Patient presents with: Headache   [name] is a 39 y.o. female.   39 year old female presenting with headache.  Patient notes that symptoms been ongoing for 8 days, describes pain in the right posterior occipital region of her head that comes and goes but has been nearly constant for 8 days now, pain is primarily in this region but does seem to radiate to other regions of the head at times, no history of headaches or migraines, has tried Tylenol /ibuprofen  at home without relief of her symptoms.  Denies fever, cough/congestion, neck pain/stiffness, vision changes, dizziness/lightheadedness, nausea/vomiting.   Headache      Prior to Admission medications  Medication Sig Start Date End Date Taking? Authorizing Provider  norethindrone  (ORTHO MICRONOR ) 0.35 MG tablet Take 1 tablet (0.35 mg total) by mouth daily. 2nd Sunday start Patient not taking: Reported on 11/30/2020 02/13/13   Rudy Carlin LABOR, MD  Prenatal Vit-Fe Fumarate-FA (PRENATAL MULTIVITAMIN) TABS Take 1 tablet by mouth daily. Patient not taking: Reported on 11/30/2020    [provider]    Allergies: Patient has no known allergies.    Review of Systems  Neurological:  Positive for headaches.    Updated Vital Signs  Vitals:   10/06/24 1522 10/06/24 1523  BP:  123/75  Pulse:  87  Resp:  16  Temp:  98.1 F (36.7 C)  TempSrc:  Oral  SpO2:  100%  Weight: 54.4 kg   Height: 5' 1 (1.549 m)      Physical Exam Vitals and nursing note reviewed.  Constitutional:      General: She is not in acute distress.    Appearance: Normal appearance. She is not ill-appearing or toxic-appearing.  HENT:     Head: Normocephalic and atraumatic.     Comments: Scalp is nontender on exam, no appreciable skin lesions however hair does obscure complete view    Right Ear: Tympanic membrane  normal.     Left Ear: Tympanic membrane normal.     Mouth/Throat:     Mouth: Mucous membranes are moist.  Eyes:     Extraocular Movements: Extraocular movements intact.     Pupils: Pupils are equal, round, and reactive to light.  Cardiovascular:     Rate and Rhythm: Normal rate and regular rhythm.  Pulmonary:     Effort: Pulmonary effort is normal.     Breath sounds: Normal breath sounds.  Musculoskeletal:     Cervical back: Normal range of motion and neck supple. No rigidity or tenderness.     Comments: Moves all extremities spontaneously without difficulty 5/5 strength against resistance of bilateral upper and lower extremities  Skin:    General: Skin is warm and dry.  Neurological:     General: No focal deficit present.     Mental Status: She is alert and oriented to person, place, and time.     Comments: Facial expressions are symmetric and intact without evidence of facial droop Normal cerebellar testing, including finger-to-nose, without ataxia Negative pronator drift Ambulates on her own without difficulty No appreciable sensory deficits or weakness     (all labs ordered are listed, but only abnormal results are displayed) Labs Reviewed  BASIC METABOLIC PANEL WITH GFR - Abnormal; Notable for the following components:      Result Value   Calcium  8.7 (*)    All other components  within normal limits  CBC WITH DIFFERENTIAL/PLATELET  HCG, SERUM, QUALITATIVE    EKG: None  Radiology: No results found.   Procedures   Medications Ordered in the ED  prochlorperazine  (COMPAZINE ) injection 10 mg (10 mg Intravenous Given 10/06/24 1743)  diphenhydrAMINE  (BENADRYL ) injection 25 mg (25 mg Intravenous Given 10/06/24 1741)  dexamethasone  (DECADRON ) injection 10 mg (10 mg Intravenous Given 10/06/24 1748)  ketorolac  (TORADOL ) 15 MG/ML injection 15 mg (15 mg Intravenous Given 10/06/24 1746)                                    Medical Decision Making This patient presents to  the ED for concern of headache, this involves an extensive number of treatment options, and is a complaint that carries with it a high risk of complications and morbidity.  The differential diagnosis includes migraine, tension HA, cluster HA, dehydration, intracranial mass, stroke vs TIA  Lab Tests:  I Ordered, and personally interpreted labs.  The pertinent results include: CBC within normal limits, no leukocytosis.  BMP unremarkable.  Serum hCG negative.    Cardiac Monitoring: / EKG:  The patient was maintained on a cardiac monitor.  I personally viewed and interpreted the cardiac monitored which showed an underlying rhythm of: NSR  Problem List / ED Course / Critical interventions / Medication management  I ordered medication including compazine /benadryl /toradol /decadron   for migraine cocktail  Reevaluation of the patient after these medicines showed that the patient improved I have reviewed the patients home medicines and have made adjustments as needed    Test / Admission - Considered:  Physical exam is unremarkable as above, no focal neurologic deficits on exam, no red flag headache signs/symptoms.  Patient is well-appearing and in no acute distress, vitals are within normal limits. Basic labs obtained and are unremarkable, will reassess patient after administration of migraine cocktail. Patient notes symptomatic improvement after administration of migraine cocktail.  I discussed with the patient that if her symptoms persist she should discuss this further with her PCP, and if symptoms return/worsen she should return to the emergency department.  At this time, given reassuring physical exam findings as above, I do not feel that additional imaging of the head/brain is warranted.  I discussed this with the patient who is in agreement, she is feeling much improved and is ready for discharge.  Return precautions discussed, she voiced understanding and is in agreement this plan, she is  appropriate for discharge at this time.     Amount and/or Complexity of Data Reviewed Labs: ordered.  Risk Prescription drug management.        Final diagnoses:  Nonintractable headache, unspecified chronicity pattern, unspecified headache type    ED Discharge Orders     None          [name], [name] 10/06/24 1836    Rogelia Jerilynn RAMAN, MD 10/10/24 1442  "

## 2024-10-06 NOTE — Discharge Instructions (Signed)
 Continue Tylenol /ibuprofen /Excedrin migraine if your headache symptoms return.  Please discuss these symptoms with your primary care provider if they persist.  Return to the emergency department if your symptoms worsen.
